# Patient Record
Sex: Male | Born: 1995 | Race: White | Hispanic: No | Marital: Single | Smoking: Current every day smoker
Health system: Southern US, Community
[De-identification: ages and names within clinical notes are randomized; demographics above are authoritative.]

## PROBLEM LIST (undated history)

## (undated) DIAGNOSIS — F319 Bipolar disorder, unspecified: Secondary | ICD-10-CM

## (undated) DIAGNOSIS — T1491XA Suicide attempt, initial encounter: Secondary | ICD-10-CM

## (undated) DIAGNOSIS — F1910 Other psychoactive substance abuse, uncomplicated: Secondary | ICD-10-CM

---

## 2016-05-18 ENCOUNTER — Encounter (HOSPITAL_COMMUNITY): Payer: Self-pay

## 2016-05-18 ENCOUNTER — Ambulatory Visit (HOSPITAL_COMMUNITY): Payer: 59 | Admitting: Psychiatry

## 2016-05-28 ENCOUNTER — Encounter (HOSPITAL_COMMUNITY): Payer: Self-pay

## 2016-05-28 ENCOUNTER — Emergency Department (HOSPITAL_COMMUNITY): Payer: 59

## 2016-05-28 ENCOUNTER — Inpatient Hospital Stay (HOSPITAL_COMMUNITY)
Admission: EM | Admit: 2016-05-28 | Discharge: 2016-05-30 | DRG: 896 | Disposition: A | Discharge status: Other Acute Inpatient Hospital | Payer: 59 | Attending: Internal Medicine | Admitting: Internal Medicine

## 2016-05-28 DIAGNOSIS — F319 Bipolar disorder, unspecified: Secondary | ICD-10-CM

## 2016-05-28 DIAGNOSIS — R000 Tachycardia, unspecified: Secondary | ICD-10-CM | POA: Diagnosis present

## 2016-05-28 DIAGNOSIS — Z046 Encounter for general psychiatric examination, requested by authority: Secondary | ICD-10-CM

## 2016-05-28 DIAGNOSIS — Z23 Encounter for immunization: Secondary | ICD-10-CM | POA: Diagnosis not present

## 2016-05-28 DIAGNOSIS — F411 Generalized anxiety disorder: Secondary | ICD-10-CM | POA: Diagnosis present

## 2016-05-28 DIAGNOSIS — Z9114 Patient's other noncompliance with medication regimen: Secondary | ICD-10-CM | POA: Diagnosis not present

## 2016-05-28 DIAGNOSIS — G4700 Insomnia, unspecified: Secondary | ICD-10-CM | POA: Diagnosis present

## 2016-05-28 DIAGNOSIS — Z4682 Encounter for fitting and adjustment of non-vascular catheter: Secondary | ICD-10-CM | POA: Diagnosis not present

## 2016-05-28 DIAGNOSIS — G92 Toxic encephalopathy: Secondary | ICD-10-CM | POA: Diagnosis not present

## 2016-05-28 DIAGNOSIS — F10129 Alcohol abuse with intoxication, unspecified: Principal | ICD-10-CM | POA: Diagnosis present

## 2016-05-28 DIAGNOSIS — F17210 Nicotine dependence, cigarettes, uncomplicated: Secondary | ICD-10-CM | POA: Diagnosis present

## 2016-05-28 DIAGNOSIS — Z818 Family history of other mental and behavioral disorders: Secondary | ICD-10-CM | POA: Diagnosis not present

## 2016-05-28 DIAGNOSIS — E876 Hypokalemia: Secondary | ICD-10-CM | POA: Diagnosis present

## 2016-05-28 DIAGNOSIS — J9600 Acute respiratory failure, unspecified whether with hypoxia or hypercapnia: Secondary | ICD-10-CM | POA: Diagnosis not present

## 2016-05-28 DIAGNOSIS — J690 Pneumonitis due to inhalation of food and vomit: Secondary | ICD-10-CM | POA: Diagnosis not present

## 2016-05-28 DIAGNOSIS — J0100 Acute maxillary sinusitis, unspecified: Secondary | ICD-10-CM | POA: Diagnosis present

## 2016-05-28 DIAGNOSIS — F10929 Alcohol use, unspecified with intoxication, unspecified: Secondary | ICD-10-CM | POA: Diagnosis not present

## 2016-05-28 DIAGNOSIS — G9340 Encephalopathy, unspecified: Secondary | ICD-10-CM | POA: Diagnosis not present

## 2016-05-28 DIAGNOSIS — Z915 Personal history of self-harm: Secondary | ICD-10-CM

## 2016-05-28 DIAGNOSIS — J9690 Respiratory failure, unspecified, unspecified whether with hypoxia or hypercapnia: Secondary | ICD-10-CM

## 2016-05-28 DIAGNOSIS — J988 Other specified respiratory disorders: Secondary | ICD-10-CM

## 2016-05-28 DIAGNOSIS — J9601 Acute respiratory failure with hypoxia: Secondary | ICD-10-CM | POA: Diagnosis not present

## 2016-05-28 DIAGNOSIS — R45850 Homicidal ideations: Secondary | ICD-10-CM | POA: Diagnosis present

## 2016-05-28 DIAGNOSIS — Y908 Blood alcohol level of 240 mg/100 ml or more: Secondary | ICD-10-CM | POA: Diagnosis not present

## 2016-05-28 DIAGNOSIS — F1024 Alcohol dependence with alcohol-induced mood disorder: Secondary | ICD-10-CM | POA: Diagnosis present

## 2016-05-28 DIAGNOSIS — R404 Transient alteration of awareness: Secondary | ICD-10-CM | POA: Diagnosis not present

## 2016-05-28 DIAGNOSIS — F1910 Other psychoactive substance abuse, uncomplicated: Secondary | ICD-10-CM

## 2016-05-28 DIAGNOSIS — R45851 Suicidal ideations: Secondary | ICD-10-CM | POA: Diagnosis present

## 2016-05-28 DIAGNOSIS — R55 Syncope and collapse: Secondary | ICD-10-CM | POA: Diagnosis not present

## 2016-05-28 DIAGNOSIS — T1491XA Suicide attempt, initial encounter: Secondary | ICD-10-CM | POA: Diagnosis not present

## 2016-05-28 DIAGNOSIS — R4182 Altered mental status, unspecified: Secondary | ICD-10-CM | POA: Diagnosis not present

## 2016-05-28 DIAGNOSIS — Z79899 Other long term (current) drug therapy: Secondary | ICD-10-CM | POA: Diagnosis not present

## 2016-05-28 DIAGNOSIS — F1014 Alcohol abuse with alcohol-induced mood disorder: Secondary | ICD-10-CM | POA: Diagnosis not present

## 2016-05-28 HISTORY — DX: Bipolar disorder, unspecified: F31.9

## 2016-05-28 HISTORY — DX: Other psychoactive substance abuse, uncomplicated: F19.10

## 2016-05-28 HISTORY — DX: Suicide attempt, initial encounter: T14.91XA

## 2016-05-28 LAB — BLOOD GAS, ARTERIAL
Acid-base deficit: 2.7 mmol/L — ABNORMAL HIGH (ref 0.0–2.0)
BICARBONATE: 22 mmol/L (ref 20.0–28.0)
DRAWN BY: 105551
FIO2: 40
MECHVT: 550 mL
O2 Saturation: 99.2 %
PEEP: 5 cmH2O
PO2 ART: 188 mmHg — AB (ref 83.0–108.0)
RATE: 16 resp/min
pCO2 arterial: 41.3 mmHg (ref 32.0–48.0)
pH, Arterial: 7.348 — ABNORMAL LOW (ref 7.350–7.450)

## 2016-05-28 LAB — URINALYSIS, ROUTINE W REFLEX MICROSCOPIC
Bilirubin Urine: NEGATIVE
Glucose, UA: NEGATIVE mg/dL
Hgb urine dipstick: NEGATIVE
Ketones, ur: NEGATIVE mg/dL
Leukocytes, UA: NEGATIVE
Nitrite: NEGATIVE
Protein, ur: NEGATIVE mg/dL
Specific Gravity, Urine: 1.006 (ref 1.005–1.030)
pH: 6 (ref 5.0–8.0)

## 2016-05-28 LAB — SALICYLATE LEVEL: Salicylate Lvl: <7 mg/dL (ref 2.8–30.0)

## 2016-05-28 LAB — RAPID URINE DRUG SCREEN, HOSP PERFORMED
AMPHETAMINES: NOT DETECTED
Barbiturates: NOT DETECTED
Benzodiazepines: NOT DETECTED
Cocaine: NOT DETECTED
OPIATES: NOT DETECTED
TETRAHYDROCANNABINOL: NOT DETECTED

## 2016-05-28 LAB — CBC WITH DIFFERENTIAL/PLATELET
BASOS ABS: 0 10*3/uL (ref 0.0–0.1)
BASOS PCT: 0 %
Eosinophils Absolute: 0.2 10*3/uL (ref 0.0–0.7)
Eosinophils Relative: 3 %
HEMATOCRIT: 53.8 % — AB (ref 39.0–52.0)
HEMOGLOBIN: 19.6 g/dL — AB (ref 13.0–17.0)
Lymphocytes Relative: 48 %
Lymphs Abs: 3.6 10*3/uL (ref 0.7–4.0)
MCH: 33.1 pg (ref 26.0–34.0)
MCHC: 36.4 g/dL — ABNORMAL HIGH (ref 30.0–36.0)
MCV: 90.9 fL (ref 78.0–100.0)
Monocytes Absolute: 0.3 10*3/uL (ref 0.1–1.0)
Monocytes Relative: 4 %
NEUTROS ABS: 3.3 10*3/uL (ref 1.7–7.7)
NEUTROS PCT: 45 %
Platelets: 305 10*3/uL (ref 150–400)
RBC: 5.92 MIL/uL — AB (ref 4.22–5.81)
RDW: 12.8 % (ref 11.5–15.5)
WBC: 7.5 10*3/uL (ref 4.0–10.5)

## 2016-05-28 LAB — COMPREHENSIVE METABOLIC PANEL WITH GFR
ALT: 33 U/L (ref 17–63)
AST: 31 U/L (ref 15–41)
Albumin: 5.3 g/dL — ABNORMAL HIGH (ref 3.5–5.0)
Alkaline Phosphatase: 86 U/L (ref 38–126)
Anion gap: 10 (ref 5–15)
BUN: 8 mg/dL (ref 6–20)
CO2: 27 mmol/L (ref 22–32)
Calcium: 8.8 mg/dL — ABNORMAL LOW (ref 8.9–10.3)
Chloride: 107 mmol/L (ref 101–111)
Creatinine, Ser: 1 mg/dL (ref 0.61–1.24)
GFR calc Af Amer: >60 mL/min
GFR calc non Af Amer: >60 mL/min
Glucose, Bld: 113 mg/dL — ABNORMAL HIGH (ref 65–99)
Potassium: 3.1 mmol/L — ABNORMAL LOW (ref 3.5–5.1)
Sodium: 144 mmol/L (ref 135–145)
Total Bilirubin: 0.4 mg/dL (ref 0.3–1.2)
Total Protein: 9 g/dL — ABNORMAL HIGH (ref 6.5–8.1)

## 2016-05-28 LAB — CK: Total CK: 92 U/L (ref 49–397)

## 2016-05-28 LAB — ACETAMINOPHEN LEVEL

## 2016-05-28 LAB — TROPONIN I: Troponin I: <0.03 ng/mL

## 2016-05-28 MED ORDER — SODIUM CHLORIDE 0.9 % IV BOLUS (SEPSIS)
1000.0000 mL | Freq: Once | INTRAVENOUS | Status: AC
Start: 2016-05-28 — End: 2016-05-28

## 2016-05-28 MED ORDER — SODIUM CHLORIDE 0.9 % IV BOLUS (SEPSIS)
1000.0000 mL | Freq: Once | INTRAVENOUS | Status: AC
  Administered 2016-05-28: 1000 mL via INTRAVENOUS

## 2016-05-28 MED ORDER — ROCURONIUM BROMIDE 50 MG/5ML IV SOLN
100.0000 mg | Freq: Once | INTRAVENOUS | Status: AC
  Administered 2016-05-28: 100 mg via INTRAVENOUS

## 2016-05-28 MED ORDER — PROPOFOL 1000 MG/100ML IV EMUL: 5.0000 ug/kg/min | Freq: Once | INTRAVENOUS | Status: DC

## 2016-05-28 MED ORDER — ETOMIDATE 2 MG/ML IV SOLN
10.0000 mg | Freq: Once | INTRAVENOUS | Status: AC
  Administered 2016-05-28: 10 mg via INTRAVENOUS

## 2016-05-28 MED ORDER — PROPOFOL 1000 MG/100ML IV EMUL
5.0000 ug/kg/min | Freq: Once | INTRAVENOUS | Status: AC
  Administered 2016-05-28: 5 ug/kg/min via INTRAVENOUS
  Filled 2016-05-28: qty 100

## 2016-05-28 MED ORDER — NALOXONE HCL 2 MG/2ML IJ SOSY
2.0000 mg | PREFILLED_SYRINGE | Freq: Once | INTRAMUSCULAR | Status: AC
  Administered 2016-05-28: 2 mg via INTRAVENOUS

## 2016-05-28 NOTE — ED Notes (Signed)
Family at bedside. 

## 2016-05-28 NOTE — ED Triage Notes (Addendum)
Law enforcement went to his house to serve IVC papers on him due to being bi-polar and not taking his medications.  Patient made statements that he would kill himself, or kill someone else and place their head on the father's porch.  [facility], EMS was called.  Patient had IV established enroute to ER by EMS, Blood sugar was 175 by EMS.

## 2016-05-28 NOTE — ED Provider Notes (Signed)
[facility]-EMERGENCY DEPT Provider Note   CSN: [id number] Arrival date & time: 05/28/16  2004 By signing my name below, I, Richard Larson, attest that this documentation has been prepared under the direction and in the presence of [name], MD . Electronically Signed: Levon Larson, Scribe. 05/28/2016. 8:34 PM.   History   Chief Complaint Chief Complaint  Patient presents with  . Altered Mental Status   LEVEL 5 CAVEAT: PATIENT UNRESPONSIVE  HPI Richard Larson is a 21 y.o. male, brought in by ambulance, who presents to the Emergency Department with a chief complaint of altered metal status with an unknown onset. Per triage note, pt has a PMHx of bipolar disorder and has been non-compliant with his medication. Patient made statements that he would kill himself, or kill someone else and place his their head on the father's porch. Law enforcement went to pt's house to serve IVC papers, but patient was found unresponsive next to a pool of vomit and a fifth bottle of an unknown alcohol beside him. No pills found near patient. No one has seen patient all day today, so it is unclear when he was last normal.   The history is provided by the EMS personnel. The history is limited by the condition of the patient. No language interpreter was used.    Past Medical History:  Diagnosis Date  . Bipolar 1 disorder (HCC)   . Drug abuse   . Suicide attempt     Patient Active Problem List   Diagnosis Date Noted  . Alcohol intoxication (HCC) 05/28/2016  . Bipolar 1 disorder (HCC) 05/28/2016  . Polysubstance abuse 05/28/2016  . Suicidal behavior with attempted self-injury 05/28/2016  . Involuntary commitment 05/28/2016  . Aspiration pneumonia (HCC) 05/28/2016    History reviewed. No pertinent surgical history.   Home Medications    Prior to Admission medications   Medication Sig Start Date End Date Taking? Authorizing Provider  ALPRAZolam [name]) 0.5 MG tablet Take 0.5 mg by mouth 4 (four) times  daily. 04/21/16  Yes Historical Provider, MD  diclofenac sodium (VOLTAREN) 1 % GEL Place 1 application onto the skin 4 (four) times daily as needed. 04/21/16  Yes Historical Provider, MD  escitalopram (LEXAPRO) 20 MG tablet Take 20 mg by mouth daily. 03/21/16 03/21/17 Yes Historical Provider, MD  zolpidem (AMBIEN) 5 MG tablet Take 5 mg by mouth at bedtime. 04/19/16 04/19/17 Yes Historical Provider, MD    Family History History reviewed. No pertinent family history.  Social History Social History  Substance Use Topics  . Smoking status: Unknown If Ever Smoked  . Smokeless tobacco: Never Used  . Alcohol use Yes     Allergies   Patient has no known allergies.   Review of Systems Review of Systems  Unable to perform ROS: Patient unresponsive   Physical Exam Updated Vital Signs BP 141/89   Pulse 100   Temp 98.1 F (36.7 C) (Axillary)   Resp 16   Ht 5\' 6"  (1.676 m)   Wt 178 lb 9.2 oz (81 kg)   SpO2 100%   BMI 28.82 kg/m   Physical Exam Physical Exam  Nursing note and vitals reviewed. Constitutional: Unresponsive Head: Normocephalic and atraumatic.  Mouth/Throat: Oropharynx is clear and moist.  Neck:Neck supple.  Cardiovascular: Tachycardic rate and regular rhythm.   Pulmonary/Chest: Effort normal and coarse breath sounds Abdominal: Soft. There is no tenderness. There is no rebound and no guarding.  Musculoskeletal: Normal range of motion.  Neurological: GCS of three, no gag reflex.  Pupils about 5mm, symmetric, reactive to light.  Skin: Skin is warm and dry.   Psychiatric: Cooperative   ED Treatments / Results  DIAGNOSTIC STUDIES:  Oxygen Saturation is 100% on non-rebreather mask.  Labs (all labs ordered are listed, but only abnormal results are displayed) Labs Reviewed  CBC WITH DIFFERENTIAL/PLATELET - Abnormal; Notable for the following:       Result Value   RBC 5.92 (*)    Hemoglobin 19.6 (*)    HCT 53.8 (*)    MCHC 36.4 (*)    All other components  within normal limits  COMPREHENSIVE METABOLIC PANEL - Abnormal; Notable for the following:    Potassium 3.1 (*)    Glucose, Bld 113 (*)    Calcium 8.8 (*)    Total Protein 9.0 (*)    Albumin 5.3 (*)    All other components within normal limits  URINALYSIS, ROUTINE W REFLEX MICROSCOPIC - Abnormal; Notable for the following:    Color, Urine STRAW (*)    All other components within normal limits  ACETAMINOPHEN LEVEL - Abnormal; Notable for the following:    Acetaminophen (Tylenol), Serum <10 (*)    All other components within normal limits  ETHANOL - Abnormal; Notable for the following:    Alcohol, Ethyl (B) 585 (*)    All other components within normal limits  BLOOD GAS, ARTERIAL - Abnormal; Notable for the following:    pH, Arterial 7.348 (*)    pO2, Arterial 188.0 (*)    Acid-base deficit 2.7 (*)    All other components within normal limits  RAPID URINE DRUG SCREEN, HOSP PERFORMED  SALICYLATE LEVEL  CK  TROPONIN I  CBC  CREATININE, SERUM  CBC  COMPREHENSIVE METABOLIC PANEL    EKG  EKG Interpretation  Date/Time:  Saturday May 28 2016 20:06:01 EST Ventricular Rate:  103 PR Interval:    QRS Duration: 100 QT Interval:  299 QTC Calculation: 392 R Axis:   51 Text Interpretation:  Sinus tachycardia Borderline repolarization abnormality no prior EKG Confirmed by Jaire Pinkham MD, Jayani Rozman [phone]) on 05/28/2016 10:52:19 PM       Radiology Ct Head Wo Contrast  Result Date: 05/28/2016 CLINICAL DATA:  Found unresponsive. EXAM: CT HEAD WITHOUT CONTRAST TECHNIQUE: Contiguous axial images were obtained from the base of the skull through the vertex without intravenous contrast. COMPARISON:  None. FINDINGS: Brain: There is no intracranial hemorrhage, mass or evidence of acute infarction. There is no extra-axial fluid collection. Gray matter and white matter appear normal. Cerebral volume is normal for age. Brainstem and posterior fossa are unremarkable. The CSF spaces appear normal. Vascular: No  hyperdense vessel or unexpected calcification. Skull: Normal. Negative for fracture or focal lesion. Sinuses/Orbits: Air-fluid levels in the maxillary sinuses bilaterally. Other: None. IMPRESSION: 1. Normal brain 2. Maxillary sinus air-fluid levels bilaterally. Electronically Signed   By: Ellery Plunkaniel R Mitchell M.D.   On: 05/28/2016 21:49   Dg Chest Portable 1 View  Result Date: 05/28/2016 CLINICAL DATA:  NG TUBE PLACEMENT, PATIENT FOUND UNRESPONSIVE BY DEPUTY WHEN SERVED WITH IVC PAPERS, EMS WAS CALLED, PATIENT HAS A HISTORY OF DRUG ABUSE, BIPOLAR 1 DISORDER, SUICIDE ATTEMPT. EXAM: PORTABLE CHEST 1 VIEW COMPARISON:  05/28/2016 FINDINGS: Endotracheal tube is in place, tip proximally 4.5 cm above the carina. A nasogastric tube has been placed, tip overlying the level of the stomach. Lungs are clear. IMPRESSION: Interval placement of nasogastric tube. Electronically Signed   By: Norva PavlovElizabeth  Brown M.D.   On: 05/28/2016 21:36   Dg Chest  Portable 1 View  Result Date: 05/28/2016 CLINICAL DATA:  Status post intubation. Found unresponsive by Designer, television/film set when they were attempting to serve IVC papers. Pt had made threats of suicide. EXAM: PORTABLE CHEST 1 VIEW COMPARISON:  None. FINDINGS: Endotracheal tube is in place with tip 4.8 cm above the carina. Heart size is normal. Lungs are clear. Patient is slightly rotated towards the right. IMPRESSION: Endotracheal tube in good position. No evidence for acute cardiopulmonary abnormality. Electronically Signed   By: Norva Pavlov M.D.   On: 05/28/2016 21:05    Procedures Procedures (including critical care time)  CRITICAL CARE Performed by: [name]   Total critical care time: 40 minutes  Critical care time was exclusive of separately billable procedures and treating other patients.  Critical care was necessary to treat or prevent imminent or life-threatening deterioration.  Critical care was time spent personally by me on the following  activities: development of treatment plan with patient and/or surrogate as well as nursing, discussions with consultants, evaluation of patient's response to treatment, examination of patient, obtaining history from patient or surrogate, ordering and performing treatments and interventions, ordering and review of laboratory studies, ordering and review of radiographic studies, pulse oximetry and re-evaluation of patient's condition.  INTUBATION Performed by: [name]  Required items: required blood products, implants, devices, and special equipment available Patient identity confirmed: provided demographic data and hospital-assigned identification number Time out: Immediately prior to procedure a "time out" was called to verify the correct patient, procedure, equipment, support staff and site/side marked as required.  Indications: airway protection  Intubation method: Glidescope Laryngoscopy   Preoxygenation: BVM  Sedatives: Etomidate Paralytic: Succinylcholine  Tube Size: 7.5 cuffed  Post-procedure assessment: chest rise and ETCO2 monitor Breath sounds: equal and absent over the epigastrium Tube secured with: ETT holder Chest x-ray interpreted by radiologist and me.  Chest x-ray findings: endotracheal tube inserted additional 2 cm as sitting high on CXR  Patient tolerated the procedure well with no immediate complications.    Medications Ordered in ED Medications  sodium chloride 0.9 % bolus 1,000 mL (0 mLs Intravenous Stopped 05/28/16 2130)  enoxaparin (LOVENOX) injection 40 mg (not administered)  sodium chloride flush (NS) 0.9 % injection 3 mL (not administered)  sodium chloride 0.9 % 1,000 mL with thiamine 100 mg, folic acid 1 mg, multivitamins adult 10 mL infusion (not administered)  dextrose 5 %-0.9 % sodium chloride infusion (not administered)  famotidine (PEPCID) IVPB 20 mg premix (not administered)  ampicillin-sulbactam (UNASYN) 1.5 g in sodium chloride 0.9 % 50 mL  IVPB (not administered)  sodium chloride 0.9 % bolus 1,000 mL (0 mLs Intravenous Stopped 05/28/16 2159)  propofol (DIPRIVAN) 1000 MG/100ML infusion (25 mcg/kg/min  77.1 kg Intravenous Rate/Dose Verify 05/29/16 0007)  naloxone [facility]) injection 2 mg (2 mg Intravenous Given 05/28/16 2007)  etomidate (AMIDATE) injection 10 mg (10 mg Intravenous Given 05/28/16 2021)  rocuronium (ZEMURON) injection 100 mg (100 mg Intravenous Given 05/28/16 2021)     Initial Impression / Assessment and Plan / ED Course  I have reviewed the triage vital signs and the nursing notes.  Pertinent labs & imaging results that were available during my care of the patient were reviewed by me and considered in my medical decision making (see chart for details).     Patient unresponsive, with GCS 3 on arrival and recent vomiting. Oxygenating normal and vital signs stable, but concerned for aspiration prior to arrival. No response with narcan. Intubated for airway protection.  CT head visualized and unremarkable. AMS from intoxication, as alcohol level of 585. Normal tylenol, and salicylate level. No acidosis. UDS unremarkable. Blood work otherwise reassuring. Father states all other psych meds aside from xanax he has not taken when he looked at his medications today. Unlikely to be other overdose.   Discussed with Dr. Conley Rolls who will admit to stepdown.   Final Clinical Impressions(s) / ED Diagnoses   Final diagnoses:  Alcoholic intoxication with complication [facility])  Airway compromise    New Prescriptions Current Discharge Medication List     I personally performed the services described in this documentation, which was scribed in my presence. The recorded information has been reviewed and is accurate.    [name], MD 05/29/16 [phone]

## 2016-05-28 NOTE — H&P (Signed)
History and Physical    Richard Larson VHQ:[id number] DOB: February 05, 1996 DOA: 05/28/2016  PCP: WHITE, Bonnell PublicMARSHA L, NP  Patient coming from: Home.    Chief Complaint:  Obtunded.   HPI: Richard SproutBraxston Kleier is an 21 y.o. male with hx of Bipolar illness, non compliant with his meds, polysubstance abuse including cig and alcohol, hx of prior suicidal ideation, was in an argument with his father whom he resided at, found obtunded when police arrived for serving IVC paper taken out by his father.  He may have some aspiration in the ER.  He was promptly intubated to protect his airways with paralytic agent, and is currently on Propofol.  His HR is 128, and BP 128.  Further work up included K 3.1, normal renal Fx test, normal WBC, and Hb of 19 g per dL.  Alcohol level 585 mg/dL, ASA and APAP were negative.  UDS showed no other illicit substance. ABG post intubation 7.34/41/POx= 188 on 40% post intubation.  CXR showed ET tube 4.5 cm above carina and NGT in place. Head CT showed sinus airfluid level.   ED Course:  See above.  Rewiew of Systems: Unable.    Past Medical History:  Diagnosis Date  . Bipolar 1 disorder (HCC)   . Drug abuse   . Suicide attempt     History reviewed. No pertinent surgical history.   has an unknown smoking status. He has never used smokeless tobacco. He reports that he drinks alcohol. He reports that he uses drugs.  No Known Allergies  History reviewed. No pertinent family history.   Prior to Admission medications   Medication Sig Start Date End Date Taking? Authorizing Provider  ALPRAZolam [name](XANAX) 0.5 MG tablet Take 0.5 mg by mouth 4 (four) times daily. 04/21/16  Yes Historical Provider, MD  diclofenac sodium (VOLTAREN) 1 % GEL Place 1 application onto the skin 4 (four) times daily as needed. 04/21/16  Yes Historical Provider, MD  escitalopram (LEXAPRO) 20 MG tablet Take 20 mg by mouth daily. 03/21/16 03/21/17 Yes Historical Provider, MD  zolpidem (AMBIEN) 5 MG tablet Take 5  mg by mouth at bedtime. 04/19/16 04/19/17 Yes Historical Provider, MD    Physical Exam: Vitals:   05/28/16 2245 05/28/16 2300 05/28/16 2315 05/28/16 2315  BP:  125/85  128/90  Pulse: 92 89 88 97  Resp: 14 16 17 20   Temp: 97.9 F (36.6 C) 97.9 F (36.6 C) 98.1 F (36.7 C) 98.4 F (36.9 C)  TempSrc:    Core (Comment)  SpO2: 100% 100% 100% 100%  Weight:      Height:    6\' 1"  (1.854 m)    Constitutional: NAD, calm, comfortable Vitals:   05/28/16 2245 05/28/16 2300 05/28/16 2315 05/28/16 2315  BP:  125/85  128/90  Pulse: 92 89 88 97  Resp: 14 16 17 20   Temp: 97.9 F (36.6 C) 97.9 F (36.6 C) 98.1 F (36.7 C) 98.4 F (36.9 C)  TempSrc:    Core (Comment)  SpO2: 100% 100% 100% 100%  Weight:      Height:    6\' 1"  (1.854 m)   Eyes: PERRL, lids and conjunctivae normal ENMT: Mucous membranes are moist. Posterior pharynx clear of any exudate or lesions.Normal dentition.  Neck: normal, supple, no masses, no thyromegaly Respiratory: clear to auscultation bilaterally, no wheezing, no crackles. Normal respiratory effort. No accessory muscle use.  Cardiovascular: Regular rate and rhythm, no murmurs / rubs / gallops. No extremity edema. 2+ pedal pulses. No carotid bruits.  Abdomen: no tenderness, no masses palpated. No hepatosplenomegaly. Bowel sounds positive.  Musculoskeletal: no clubbing / cyanosis. No joint deformity upper and lower extremities. Good ROM, no contractures. Normal muscle tone.  Skin: no rashes, lesions, ulcers. No induration Neurologic: obtunded.  Psychiatric: Unable.   Labs on Admission: I have personally reviewed following labs and imaging studies  CBC:  Recent Labs Lab 05/28/16 2029  WBC 7.5  NEUTROABS 3.3  HGB 19.6*  HCT 53.8*  MCV 90.9  PLT 305   Basic Metabolic Panel:  Recent Labs Lab 05/28/16 2029  NA 144  K 3.1*  CL 107  CO2 27  GLUCOSE 113*  BUN 8  CREATININE 1.00  CALCIUM 8.8*   GFR: Estimated Creatinine Clearance: 128.5 mL/min (by  C-G formula based on SCr of 1 mg/dL). Liver Function Tests:  Recent Labs Lab 05/28/16 2029  AST 31  ALT 33  ALKPHOS 86  BILITOT 0.4  PROT 9.0*  ALBUMIN 5.3*   Cardiac Enzymes:  Recent Labs Lab 05/28/16 2029 05/28/16 2121  CKTOTAL 92  --   TROPONINI  --  <0.03    Urine analysis:    Component Value Date/Time   COLORURINE STRAW (A) 05/28/2016 2121   APPEARANCEUR CLEAR 05/28/2016 2121   LABSPEC 1.006 05/28/2016 2121   PHURINE 6.0 05/28/2016 2121   GLUCOSEU NEGATIVE 05/28/2016 2121   HGBUR NEGATIVE 05/28/2016 2121   BILIRUBINUR NEGATIVE 05/28/2016 2121   KETONESUR NEGATIVE 05/28/2016 2121   PROTEINUR NEGATIVE 05/28/2016 2121   NITRITE NEGATIVE 05/28/2016 2121   LEUKOCYTESUR NEGATIVE 05/28/2016 2121    Radiological Exams on Admission: Ct Head Wo Contrast  Result Date: 05/28/2016 CLINICAL DATA:  Found unresponsive. EXAM: CT HEAD WITHOUT CONTRAST TECHNIQUE: Contiguous axial images were obtained from the base of the skull through the vertex without intravenous contrast. COMPARISON:  None. FINDINGS: Brain: There is no intracranial hemorrhage, mass or evidence of acute infarction. There is no extra-axial fluid collection. Gray matter and white matter appear normal. Cerebral volume is normal for age. Brainstem and posterior fossa are unremarkable. The CSF spaces appear normal. Vascular: No hyperdense vessel or unexpected calcification. Skull: Normal. Negative for fracture or focal lesion. Sinuses/Orbits: Air-fluid levels in the maxillary sinuses bilaterally. Other: None. IMPRESSION: 1. Normal brain 2. Maxillary sinus air-fluid levels bilaterally. Electronically Signed   By: Ellery Plunk M.D.   On: 05/28/2016 21:49   Dg Chest Portable 1 View  Result Date: 05/28/2016 CLINICAL DATA:  NG TUBE PLACEMENT, PATIENT FOUND UNRESPONSIVE BY DEPUTY WHEN SERVED WITH IVC PAPERS, EMS WAS CALLED, PATIENT HAS A HISTORY OF DRUG ABUSE, BIPOLAR 1 DISORDER, SUICIDE ATTEMPT. EXAM: PORTABLE CHEST 1 VIEW  COMPARISON:  05/28/2016 FINDINGS: Endotracheal tube is in place, tip proximally 4.5 cm above the carina. A nasogastric tube has been placed, tip overlying the level of the stomach. Lungs are clear. IMPRESSION: Interval placement of nasogastric tube. Electronically Signed   By: Norva Pavlov M.D.   On: 05/28/2016 21:36   Dg Chest Portable 1 View  Result Date: 05/28/2016 CLINICAL DATA:  Status post intubation. Found unresponsive by Designer, television/film set when they were attempting to serve IVC papers. Pt had made threats of suicide. EXAM: PORTABLE CHEST 1 VIEW COMPARISON:  None. FINDINGS: Endotracheal tube is in place with tip 4.8 cm above the carina. Heart size is normal. Lungs are clear. Patient is slightly rotated towards the right. IMPRESSION: Endotracheal tube in good position. No evidence for acute cardiopulmonary abnormality. Electronically Signed   By: Norva Pavlov M.D.   On:  05/28/2016 21:05    EKG: Independently reviewed.   Assessment/Plan Principal Problem:   Alcohol intoxication (HCC) Active Problems:   Bipolar 1 disorder (HCC)   Polysubstance abuse   Suicidal behavior with attempted self-injury   Involuntary commitment   Aspiration pneumonia (HCC)    PLAN:   Alcohol intoxication:  Give Dextrose, along with IV banana bag.  Binge drinker.  Currently on IV propofol.  Will consult PCCM here at [facility] to help with weaning when appropriate.   Keep intubated for tonight.  Will given IV Unasyn for sinusitis and known aspiration in the ER.  Give PPI prophylasix.   Bipolar Disorder, non compliant, hx of suicidal ideation:  Please consult telepsych when he is awake and able to converse.  Note his IVC status.   Intubation:  It was for airway protection.  Suspect he will have no trouble with eventual extubation.  Sinusitis:  Will start Unasyn.  Cover for aspiration as well.    DVT prophylaxis: sub Q heparin.  Code Status: FULL CODE.  Family Communication: father at bedside.    Disposition Plan: TBD.  Consults called: None.  Admission status: Inpatient.    Chen Saadeh MD FACP. [facility]  If 7PM-7AM, please contact night-coverage [email or web address] Password [email or web address]  05/28/2016, 11:44 PM

## 2016-05-29 ENCOUNTER — Encounter (HOSPITAL_COMMUNITY): Payer: Self-pay | Admitting: Behavioral Health

## 2016-05-29 ENCOUNTER — Inpatient Hospital Stay (HOSPITAL_COMMUNITY): Payer: 59

## 2016-05-29 DIAGNOSIS — F10929 Alcohol use, unspecified with intoxication, unspecified: Secondary | ICD-10-CM

## 2016-05-29 DIAGNOSIS — J9600 Acute respiratory failure, unspecified whether with hypoxia or hypercapnia: Secondary | ICD-10-CM

## 2016-05-29 DIAGNOSIS — T1491XA Suicide attempt, initial encounter: Secondary | ICD-10-CM

## 2016-05-29 DIAGNOSIS — F1010 Alcohol abuse, uncomplicated: Secondary | ICD-10-CM

## 2016-05-29 DIAGNOSIS — F1910 Other psychoactive substance abuse, uncomplicated: Secondary | ICD-10-CM

## 2016-05-29 DIAGNOSIS — G9340 Encephalopathy, unspecified: Secondary | ICD-10-CM

## 2016-05-29 DIAGNOSIS — J690 Pneumonitis due to inhalation of food and vomit: Secondary | ICD-10-CM

## 2016-05-29 DIAGNOSIS — F489 Nonpsychotic mental disorder, unspecified: Secondary | ICD-10-CM

## 2016-05-29 DIAGNOSIS — E876 Hypokalemia: Secondary | ICD-10-CM

## 2016-05-29 LAB — COMPREHENSIVE METABOLIC PANEL
ALBUMIN: 3.9 g/dL (ref 3.5–5.0)
ALK PHOS: 60 U/L (ref 38–126)
ALT: 23 U/L (ref 17–63)
ANION GAP: 13 (ref 5–15)
AST: 18 U/L (ref 15–41)
BILIRUBIN TOTAL: 0.4 mg/dL (ref 0.3–1.2)
BUN: 6 mg/dL (ref 6–20)
CALCIUM: 7.8 mg/dL — AB (ref 8.9–10.3)
CO2: 21 mmol/L — ABNORMAL LOW (ref 22–32)
Chloride: 111 mmol/L (ref 101–111)
Creatinine, Ser: 0.79 mg/dL (ref 0.61–1.24)
GFR calc Af Amer: >60 mL/min (ref >60)
GLUCOSE: 126 mg/dL — AB (ref 65–99)
Potassium: 3.2 mmol/L — ABNORMAL LOW (ref 3.5–5.1)
Sodium: 145 mmol/L (ref 135–145)
TOTAL PROTEIN: 6.5 g/dL (ref 6.5–8.1)

## 2016-05-29 LAB — CBC
HEMATOCRIT: 42.6 % (ref 39.0–52.0)
HEMOGLOBIN: 15.4 g/dL (ref 13.0–17.0)
MCH: 32.5 pg (ref 26.0–34.0)
MCHC: 36.2 g/dL — AB (ref 30.0–36.0)
MCV: 89.9 fL (ref 78.0–100.0)
Platelets: 274 10*3/uL (ref 150–400)
RBC: 4.74 MIL/uL (ref 4.22–5.81)
RDW: 12.9 % (ref 11.5–15.5)
WBC: 8.4 10*3/uL (ref 4.0–10.5)

## 2016-05-29 LAB — VITAMIN B12: VITAMIN B 12: 420 pg/mL (ref 180–914)

## 2016-05-29 LAB — ETHANOL: ALCOHOL ETHYL (B): 585 mg/dL — AB (ref <5)

## 2016-05-29 LAB — TROPONIN I: Troponin I: <0.03 ng/mL (ref <0.03)

## 2016-05-29 LAB — TSH: TSH: 1.423 u[IU]/mL (ref 0.350–4.500)

## 2016-05-29 LAB — MAGNESIUM: MAGNESIUM: 1.7 mg/dL (ref 1.7–2.4)

## 2016-05-29 MED ORDER — HALOPERIDOL LACTATE 5 MG/ML IJ SOLN
5.0000 mg | Freq: Four times a day (QID) | INTRAMUSCULAR | Status: DC | PRN
  Administered 2016-05-29 (×3): 5 mg via INTRAVENOUS
  Filled 2016-05-29 (×3): qty 1

## 2016-05-29 MED ORDER — POTASSIUM CHLORIDE 10 MEQ/100ML IV SOLN
10.0000 meq | INTRAVENOUS | Status: AC
  Administered 2016-05-29 (×3): 10 meq via INTRAVENOUS
  Filled 2016-05-29 (×2): qty 100

## 2016-05-29 MED ORDER — DEXTROSE-NACL 5-0.9 % IV SOLN
INTRAVENOUS | Status: DC
  Administered 2016-05-29: 01:00:00 via INTRAVENOUS

## 2016-05-29 MED ORDER — FOLIC ACID 5 MG/ML IJ SOLN
INTRAMUSCULAR | Status: AC
  Filled 2016-05-29: qty 0.2

## 2016-05-29 MED ORDER — SODIUM CHLORIDE 0.9 % IV SOLN
INTRAVENOUS | Status: AC
  Filled 2016-05-29: qty 1.5

## 2016-05-29 MED ORDER — DEXTROSE-NACL 5-0.9 % IV SOLN
INTRAVENOUS | Status: DC
  Administered 2016-05-29 – 2016-05-30 (×3): via INTRAVENOUS
  Filled 2016-05-29 (×6): qty 1000

## 2016-05-29 MED ORDER — THIAMINE HCL 100 MG/ML IJ SOLN
INTRAMUSCULAR | Status: AC
  Filled 2016-05-29: qty 2

## 2016-05-29 MED ORDER — THIAMINE HCL 100 MG/ML IJ SOLN
100.0000 mg | Freq: Every day | INTRAMUSCULAR | Status: DC
  Administered 2016-05-29 – 2016-05-30 (×2): 100 mg via INTRAVENOUS
  Filled 2016-05-29 (×2): qty 2

## 2016-05-29 MED ORDER — PROPOFOL 1000 MG/100ML IV EMUL
INTRAVENOUS | Status: AC
  Filled 2016-05-29: qty 100

## 2016-05-29 MED ORDER — POTASSIUM CHLORIDE CRYS ER 20 MEQ PO TBCR: 30.0000 meq | EXTENDED_RELEASE_TABLET | Freq: Once | ORAL | Status: DC

## 2016-05-29 MED ORDER — PROPOFOL 1000 MG/100ML IV EMUL
5.0000 ug/kg/min | Freq: Once | INTRAVENOUS | Status: AC
  Administered 2016-05-29: 40 ug/kg/min via INTRAVENOUS

## 2016-05-29 MED ORDER — CITALOPRAM HYDROBROMIDE 20 MG PO TABS
20.0000 mg | ORAL_TABLET | Freq: Every day | ORAL | Status: DC
  Administered 2016-05-29 – 2016-05-30 (×2): 20 mg via ORAL
  Filled 2016-05-29 (×3): qty 1

## 2016-05-29 MED ORDER — SODIUM CHLORIDE 0.9% FLUSH
3.0000 mL | Freq: Two times a day (BID) | INTRAVENOUS | Status: DC
  Administered 2016-05-29 (×2): 3 mL via INTRAVENOUS

## 2016-05-29 MED ORDER — POTASSIUM CHLORIDE CRYS ER 10 MEQ PO TBCR
10.0000 meq | EXTENDED_RELEASE_TABLET | Freq: Once | ORAL | Status: AC
  Administered 2016-05-29: 10 meq via ORAL
  Filled 2016-05-29: qty 1

## 2016-05-29 MED ORDER — AMOXICILLIN-POT CLAVULANATE 875-125 MG PO TABS: 1.0000 | ORAL_TABLET | Freq: Two times a day (BID) | ORAL | 0 refills | Status: DC

## 2016-05-29 MED ORDER — ENOXAPARIN SODIUM 40 MG/0.4ML ~~LOC~~ SOLN
40.0000 mg | SUBCUTANEOUS | Status: DC
Start: 2016-05-29 — End: 2016-05-30
  Administered 2016-05-29 – 2016-05-30 (×2): 40 mg via SUBCUTANEOUS
  Filled 2016-05-29 (×2): qty 0.4

## 2016-05-29 MED ORDER — NICOTINE 14 MG/24HR TD PT24
14.0000 mg | MEDICATED_PATCH | Freq: Every day | TRANSDERMAL | Status: DC
  Administered 2016-05-29 – 2016-05-30 (×2): 14 mg via TRANSDERMAL
  Filled 2016-05-29 (×2): qty 1

## 2016-05-29 MED ORDER — SODIUM CHLORIDE 0.9 % IV SOLN
1.5000 g | Freq: Four times a day (QID) | INTRAVENOUS | Status: DC
  Administered 2016-05-29 (×2): 1.5 g via INTRAVENOUS
  Filled 2016-05-29 (×7): qty 1.5

## 2016-05-29 MED ORDER — M.V.I. ADULT IV INJ
INJECTION | Freq: Once | INTRAVENOUS | Status: AC
  Administered 2016-05-29: 02:00:00 via INTRAVENOUS
  Filled 2016-05-29: qty 1000

## 2016-05-29 MED ORDER — M.V.I. ADULT IV INJ
INJECTION | INTRAVENOUS | Status: AC
  Filled 2016-05-29: qty 10

## 2016-05-29 MED ORDER — METOPROLOL TARTRATE 25 MG PO TABS
12.5000 mg | ORAL_TABLET | Freq: Two times a day (BID) | ORAL | Status: DC
  Administered 2016-05-29 – 2016-05-30 (×3): 12.5 mg via ORAL
  Filled 2016-05-29 (×4): qty 1

## 2016-05-29 MED ORDER — FAMOTIDINE IN NACL 20-0.9 MG/50ML-% IV SOLN
20.0000 mg | Freq: Two times a day (BID) | INTRAVENOUS | Status: DC
  Administered 2016-05-29 – 2016-05-30 (×4): 20 mg via INTRAVENOUS
  Filled 2016-05-29 (×4): qty 50

## 2016-05-29 MED ORDER — AMOXICILLIN-POT CLAVULANATE 875-125 MG PO TABS
1.0000 | ORAL_TABLET | Freq: Two times a day (BID) | ORAL | Status: DC
  Administered 2016-05-29 – 2016-05-30 (×3): 1 via ORAL
  Filled 2016-05-29 (×7): qty 1

## 2016-05-29 MED ORDER — LORAZEPAM 2 MG/ML IJ SOLN
2.0000 mg | INTRAMUSCULAR | Status: DC | PRN
  Administered 2016-05-29 (×2): 3 mg via INTRAVENOUS
  Administered 2016-05-30: 2 mg via INTRAVENOUS
  Filled 2016-05-29 (×3): qty 2
  Filled 2016-05-29: qty 1
  Filled 2016-05-29: qty 2

## 2016-05-29 NOTE — BH Assessment (Signed)
Binnie RailJoAnn Glover, [facility] at  [facility], confirmed that a bed will be available for Pt in the morning. Pt has been accepted to the service of Dr. Carmon GinsbergF. Cobos. [facility] staff will need to call [facility] at [phone] after 0800 for bed assignment and transfer time.  Notified Liborio NixonJanice, Charity fundraiserN at [facility] of acceptance.   Harlin RainFord Ellis Patsy BaltimoreWarrick Jr, LPC, [facility], [facility] Triage Specialist [phone]

## 2016-05-29 NOTE — Progress Notes (Signed)
Patient moved 3 different times by RT , once to CAT scan, once to er 18 from room 1, one from er to iccu 5.

## 2016-05-29 NOTE — BH Assessment (Signed)
Tele Assessment Note   Richard Larson is an 21 y.o. male who presented to [facility] on 05/28/16 under IVC -- father petitioned for involuntary commitment after Pt ingested a significant amount of alcohol and, per father's report, threatened to kill himself and others (cutting heads off and putting them on father's porch).  Pt was found comatose in front of the family home he shares with his father and had to be given Narcan.  Pt was intubated and put in the ICU.  He is now medically cleared.  Per report, Pt was belligerent this morning and extubated himself.  He was calm during assessment.  Pt reported that he got upset yesterday because of conflict he is having with his girlfriend over the custody of their daughter.  He denied current suicidal/homicidal ideation, auditory/visual hallucination, or self-injury.  He stated that he wanted to go home.  Father, who petitioned for IVC, reported that yesterday Pt consumed at least 770 ml of alcohol, began to make suicidal threats against himself (which father said he has done before), and threatened to cut the heads off of various people.  Upon admission, Pt had a BAL of 595.  Pt reported that he has a history of anxiety and depression for which he is prescribed Xanax and Ambien (Pt could not identify when he was diagnosed or who prescribed).  Pt is not taking any medication currently.  Per report, Pt has a standing diagnosis of Bipolar.  Also per report -- Pt was combative with staff earlier today.  During assessment, Pt presented as alert and oriented.  He had fair eye contact and was cooperative.  He was resting on his hospital bed and was dressed in scrubs or gown pulled down to his waist.  Pt's mood was preoccupied and irritable/angry.  Affect was sad, irritable/angry.  Pt denied current suicidal or homicidal ideation (although father stated that he expressed such on 05/28/16).  Pt endorsed poor sleep, irritability, and significant social stressors (custody issue with  girlfriend).  Pt's speech was normal in rate, rhythm, and volume.  Thought processes were within normal range, and thought content was logical.  He stated he wanted to go home.  There was no evidence of delusion.  Pt's memory and concentration were fair.  Insight, judgment, and impulse control were poor.  Consulted with Irving BurtonL Parks, NP.  Based on Pt's suicidal/homicidal statements and impulsive and large intake of alcohol, recommend inpatient placement.  Diagnosis: Bipolar D/O per report; Alcohol-Induced Mood Disorder  Past Medical History:  Past Medical History:  Diagnosis Date  . Bipolar 1 disorder (HCC)   . Drug abuse   . Suicide attempt     History reviewed. No pertinent surgical history.  Family History: History reviewed. No pertinent family history.  Social History:  has an unknown smoking status. He has never used smokeless tobacco. He reports that he drinks alcohol. He reports that he does not use drugs.  Additional Social History:  Alcohol / Drug Use Pain Medications: See PTA Prescriptions: See PTA Over the Counter: See PTA History of alcohol / drug use?: Yes Substance #1 Name of Substance 1: Alcohol 1 - Amount (size/oz): 750 ml 1 - Frequency: Daily 1 - Duration: ongoing 1 - Last Use / Amount: 05/28/16 (BAL was 585)  CIWA: CIWA-Ar BP: 117/73 Pulse Rate: 92 Nausea and Vomiting: no nausea and no vomiting Tactile Disturbances: none Tremor: no tremor Auditory Disturbances: not present Paroxysmal Sweats: no sweat visible Visual Disturbances: not present Anxiety: no anxiety, at ease Headache,  Fullness in Head: none present Agitation: normal activity Orientation and Clouding of Sensorium: oriented and can do serial additions CIWA-Ar Total: 0 COWS:    PATIENT STRENGTHS: (choose at least two) Average or above average intelligence Communication skills  Allergies: No Known Allergies  Home Medications:  Medications Prior to Admission  Medication Sig Dispense Refill  .  ALPRAZolam (XANAX) 0.5 MG tablet Take 0.5 mg by mouth 4 (four) times daily.    . diclofenac sodium (VOLTAREN) 1 % GEL Place 1 application onto the skin 4 (four) times daily as needed.    Marland Kitchen escitalopram (LEXAPRO) 20 MG tablet Take 20 mg by mouth daily.    Marland Kitchen zolpidem (AMBIEN) 5 MG tablet Take 5 mg by mouth at bedtime.      OB/GYN Status:  No LMP for male patient.  General Assessment Data Location of Assessment: [facility] ([facility]) TTS Assessment: In system Is this a Tele or Face-to-Face Assessment?: Tele Assessment Is this an Initial Assessment or a Re-assessment for this encounter?: Initial Assessment Marital status: Single Living Arrangements: Parent (Lives with father) Can pt return to current living arrangement?: Yes Admission Status: Voluntary Is patient capable of signing voluntary admission?: Yes Referral Source: Self/Family/Friend (Father)     Crisis Care Plan Living Arrangements: Parent (Lives with father) Name of Psychiatrist: None Name of Therapist: None  Education Status Is patient currently in school?: No  Risk to self with the past 6 months Suicidal Ideation: No-Not Currently/Within Last 6 Months (Per father's report, Pt endorsed SI and HI last night) Has patient been a risk to self within the past 6 months prior to admission? : No Suicidal Intent: No-Not Currently/Within Last 6 Months Has patient had any suicidal intent within the past 6 months prior to admission? : No Is patient at risk for suicide?: Yes Suicidal Plan?: No Has patient had any suicidal plan within the past 6 months prior to admission? : No Access to Means: No What has been your use of drugs/alcohol within the last 12 months?: Alcohol Previous Attempts/Gestures: No Other Self Harm Risks: Significant alcohol use Family Suicide History: No Recent stressful life event(s): Conflict (Comment) (Conflict with girlfriend over daughter) Persecutory voices/beliefs?: No Depression: Yes Depression Symptoms:  Despondent, Feeling worthless/self pity, Feeling angry/irritable, Insomnia Substance abuse history and/or treatment for substance abuse?: Yes Suicide prevention information given to non-admitted patients: Not applicable  Risk to Others within the past 6 months Homicidal Ideation: No-Not Currently/Within Last 6 Months Does patient have any lifetime risk of violence toward others beyond the six months prior to admission? : No Thoughts of Harm to Others: No-Not Currently Present/Within Last 6 Months Current Homicidal Intent: No-Not Currently/Within Last 6 Months Current Homicidal Plan: No-Not Currently/Within Last 6 Months Access to Homicidal Means: No History of harm to others?: No Assessment of Violence: None Noted Does patient have access to weapons?: No Criminal Charges Pending?: No Does patient have a court date: No Is patient on probation?: No  Psychosis Hallucinations: None noted Delusions: None noted  Mental Status Report Appearance/Hygiene: Unremarkable, In scrubs Eye Contact: Fair Motor Activity: Freedom of movement, Unremarkable Speech: Unremarkable, Logical/coherent Level of Consciousness: Alert Mood: Preoccupied, Irritable, Sad Affect: Appropriate to circumstance Anxiety Level: None Thought Processes: Coherent, Relevant Judgement: Impaired Orientation: Person, Place, Time, Situation Obsessive Compulsive Thoughts/Behaviors: None  Cognitive Functioning Concentration: Good Memory: Remote Intact, Recent Intact IQ: Average Insight: Poor Impulse Control: Poor Appetite: Good Sleep: Decreased Total Hours of Sleep: 5 Vegetative Symptoms: None  ADLScreening [facility] Assessment Services) Patient's  cognitive ability adequate to safely complete daily activities?: Yes Patient able to express need for assistance with ADLs?: Yes Independently performs ADLs?: Yes (appropriate for developmental age)  Prior Inpatient Therapy Prior Inpatient Therapy: No  Prior Outpatient  Therapy Prior Outpatient Therapy: Yes Prior Therapy Dates: Unknown Prior Therapy Facilty/Provider(s): Unknown Reason for Treatment: Bipolar Does patient have an ACCT team?: No Does patient have Intensive In-House Services?  : No Does patient have [facility] services? : No Does patient have P4CC services?: No  ADL Screening (condition at time of admission) Patient's cognitive ability adequate to safely complete daily activities?: Yes Is the patient deaf or have difficulty hearing?: No Does the patient have difficulty seeing, even when wearing glasses/contacts?: No Does the patient have difficulty concentrating, remembering, or making decisions?: No Patient able to express need for assistance with ADLs?: Yes Does the patient have difficulty dressing or bathing?: No Independently performs ADLs?: Yes (appropriate for developmental age) Does the patient have difficulty walking or climbing stairs?: No Weakness of Legs: None Weakness of Arms/Hands: None  Home Assistive Devices/Equipment Home Assistive Devices/Equipment: None  Therapy Consults (therapy consults require a physician order) PT Evaluation Needed: No OT Evalulation Needed: No SLP Evaluation Needed: No Abuse/Neglect Assessment (Assessment to be complete while patient is alone) Physical Abuse: Denies Verbal Abuse: Denies Sexual Abuse: Denies Exploitation of patient/patient's resources: Denies Self-Neglect: Denies Values / Beliefs Cultural Requests During Hospitalization: None Spiritual Requests During Hospitalization: None Consults Spiritual Care Consult Needed: No Social Work Consult Needed: No Merchant navy officer (For Healthcare) Does Patient Have a Medical Advance Directive?: No Would patient like information on creating a medical advance directive?: Yes (Inpatient - patient requests chaplain consult to create a medical advance directive)    Additional Information 1:1 In Past 12 Months?: No CIRT Risk: No Elopement  Risk: No Does patient have medical clearance?: Yes     Disposition:  Disposition Initial Assessment Completed for this Encounter: Yes Disposition of Patient: Inpatient treatment program Type of inpatient treatment program: Adult (Per Irving Burton NP Pt meets inpt criteria)  Richard Larson 05/29/2016 3:16 PM

## 2016-05-29 NOTE — Progress Notes (Signed)
Patient cleaned up, sheets changed and put in scrub pants.

## 2016-05-29 NOTE — Progress Notes (Signed)
Pt became very agitated took his tube out, leads off, got out of his restraints, cursing at staff, threaten to leave. Security was called, Patient walked out of room and was walking nude in the hallway disturbing other pts. Refusing help from staff while swinging at security. Security got him back in bed myself and other nurses helped assist security,  patient was still physical with staff.  [employer] was called to assist. [employer] came out and got him secured. Patient is calm and is handcuffed to the bed. [employer] and [employer] is still assisting us at this time.

## 2016-05-29 NOTE — Discharge Summary (Signed)
Physician Discharge Summary  Richard Larson ZOX:[id number] DOB: 1995-07-10 DOA: 05/28/2016  PCP: April Manson, NP  Admit date: 05/28/2016 Discharge date: 05/29/2016  Admitted From: Home Disposition:  Behavioral Health Institution  Recommendations for Outpatient Follow-up:  1. Follow up with PCP in 1-2 weeks 2. Please obtain BMP/CBC in one week 3. Please follow up on Chevy antibody, hepatitis B surface antigen, hepatitis C antibody, RPR results   Discharge Condition: Stable CODE STATUS:FULL Diet recommendation: Regular   Brief/Interim Summary: 21 year old male with a history of bipolar disorder noncompliant with medications presented to the emergency department unresponsive. History initially obtained from medical record and the previous triage notes. Apparently, the patient had an argument with his father with whom he lives earlier in the day. Patient made statements that he would kill himself, or kill someone else and place his their head on the father's porch. Law enforcement went to pt'shouse to serve IVC papers, but patient was found unresponsive next to a pool of vomit and a fifth bottle of an unknown alcohol beside him. Narcan was given with no response. The patient was intubated in the emergency department protect his airway. Initial blood work showed alcohol level 585.  Besides potassium of 3.1, his BMP, LFTs and CBC were unremarkable. Chest x-ray was negative. CT of the brain was negative except for air-fluid level in the maxillary sinus. CPK was 92. Urine drug screen, salicylate, acetaminophen was negative. Pulmonary medicine was consulted to see the patient and assist with vent management. Once the patient's sedation was decreased, the patient self extubated. Soon thereafter, the patient became belligerent and agitated threatening harm to the nursing staff and required restraining by police and security. The patient was given Haldol and Ativan with improvement of his behavior. The  patient's vital signs remained stable. He was afebrile and hemodynamically stable with oxygen saturation 100 percent on room air. The patient was reassessed after his behavior improved. He denied any fevers, chills, chest pain, shortness breath, dizziness, coughing, hemoptysis, nausea, vomiting, abdominal pain, dysuria, hematuria. The patient was alert and oriented 4. The patient did become tachycardic with HR 120-140. However, he was asymptomatic. EKG was obtained and showed sinus tachycardia without ST-T wave changes or AV block. The patient was hypokalemic, and this was repleted. The remainder of his CMP and CBC were unremarkable. TSH was 1.423, and troponins were negative 2. The remainder of his blood work including HIV, viral hepatitis serologies, and RPR were ending at the time of discharge. The patient was deemed to be medically stable to be discharged to a behavioral health facility.  During the hospitalization, his father did arrive and stated that the patient did not take any extraneous pills or other illicit substances. His father stated that the patient has a history of binge alcohol drinking. Prior to 05/28/2016, the patient last drank approximately 1 month prior to this admission. His father stated that he instituted the IVC in order to get the patient some help.  Discharge Diagnoses:  Acute toxic encephalopathy -Secondary to alcohol intoxication -Urine drug screen negative -Alcohol level 585 -CK 92 -Salicylate and acetaminophen levels negative -Started thiamine -Serum B12--pending -TSH--pending -personally reviewed EKG--sinus tachycardia, nonspecific ST changes -improved; remained A&Ox4 after extubation and sedation turned off  Suicidal ideation/suicide attempt/bipolar disorder -Patient was IVC by his family--he was served his IVC by law enforcement once he was deemed medically stable, which he is -consulted telepsychiatry--follow their recommendation -pt is deemed stable from  medical standpoint to go to behavioral heatlh facility  Acute  respiratory failure with hypoxia -Consult critical care to assist with ventilator management -Secondary to alcohol intoxication -H2 blockade prophylaxis -personally reviewed CXR--lungs clear -After extubation, the patient did not have any distress or shortness of breath with oxygen saturation 96-100% on room air.  Aspiration event -Patient was found next to pool of vomitus -Continue Unasyn--initially continued, but this will be treated discontinued at the time of discharge -d/c with augmentin x 6 more days  Hypokalemia -repleted -check mag--1.7  Polysubstance abuse -Etoh and tobacco -thiamine  Acute Maxillary Sinusitis -continue unasyn for now--initially continued, but this will be treated discontinued at the time of discharge -d/c with Augmentin x 6 more days  Health maintenance -HIV antibody--pending at time of discharge -Hep B surface antigen--pending at time of discharge -Hep C antibody--pending at time of discharge -RPR--pending at time of discharge    Discharge Instructions  Discharge Instructions    Diet general    Complete by:  As directed    Increase activity slowly    Complete by:  As directed      Allergies as of 05/29/2016   No Known Allergies     Medication List    STOP taking these medications   zolpidem 5 MG tablet Commonly known as:  AMBIEN     TAKE these medications   ALPRAZolam 0.5 MG tablet Commonly known as:  XANAX Take 0.5 mg by mouth 4 (four) times daily.   amoxicillin-clavulanate 875-125 MG tablet Commonly known as:  AUGMENTIN Take 1 tablet by mouth every 12 (twelve) hours.   diclofenac sodium 1 % Gel Commonly known as:  VOLTAREN Place 1 application onto the skin 4 (four) times daily as needed.   escitalopram 20 MG tablet Commonly known as:  LEXAPRO Take 20 mg by mouth daily.       No Known  Allergies  Consultations:  Psychiatry   Procedures/Studies: Ct Head Wo Contrast  Result Date: 05/28/2016 CLINICAL DATA:  Found unresponsive. EXAM: CT HEAD WITHOUT CONTRAST TECHNIQUE: Contiguous axial images were obtained from the base of the skull through the vertex without intravenous contrast. COMPARISON:  None. FINDINGS: Brain: There is no intracranial hemorrhage, mass or evidence of acute infarction. There is no extra-axial fluid collection. Gray matter and white matter appear normal. Cerebral volume is normal for age. Brainstem and posterior fossa are unremarkable. The CSF spaces appear normal. Vascular: No hyperdense vessel or unexpected calcification. Skull: Normal. Negative for fracture or focal lesion. Sinuses/Orbits: Air-fluid levels in the maxillary sinuses bilaterally. Other: None. IMPRESSION: 1. Normal brain 2. Maxillary sinus air-fluid levels bilaterally. Electronically Signed   By: Ellery Plunkaniel R Mitchell M.D.   On: 05/28/2016 21:49   Dg Chest Portable 1 View  Result Date: 05/28/2016 CLINICAL DATA:  NG TUBE PLACEMENT, PATIENT FOUND UNRESPONSIVE BY DEPUTY WHEN SERVED WITH IVC PAPERS, EMS WAS CALLED, PATIENT HAS A HISTORY OF DRUG ABUSE, BIPOLAR 1 DISORDER, SUICIDE ATTEMPT. EXAM: PORTABLE CHEST 1 VIEW COMPARISON:  05/28/2016 FINDINGS: Endotracheal tube is in place, tip proximally 4.5 cm above the carina. A nasogastric tube has been placed, tip overlying the level of the stomach. Lungs are clear. IMPRESSION: Interval placement of nasogastric tube. Electronically Signed   By: Norva PavlovElizabeth  Brown M.D.   On: 05/28/2016 21:36   Dg Chest Portable 1 View  Result Date: 05/28/2016 CLINICAL DATA:  Status post intubation. Found unresponsive by Designer, television/film setlaw enforcement officers when they were attempting to serve IVC papers. Pt had made threats of suicide. EXAM: PORTABLE CHEST 1 VIEW COMPARISON:  None. FINDINGS: Endotracheal tube is  in place with tip 4.8 cm above the carina. Heart size is normal. Lungs are clear.  Patient is slightly rotated towards the right. IMPRESSION: Endotracheal tube in good position. No evidence for acute cardiopulmonary abnormality. Electronically Signed   By: Norva Pavlov M.D.   On: 05/28/2016 21:05        Discharge Exam: Vitals:   05/29/16 0600 05/29/16 0737  BP: 117/73   Pulse: 89 92  Resp: 16 16  Temp:  98.4 F (36.9 C)   Vitals:   05/29/16 0500 05/29/16 0600 05/29/16 0737 05/29/16 0842  BP: 117/72 117/73    Pulse: 92 89 92   Resp: 16 16 16    Temp:   98.4 F (36.9 C)   TempSrc:   Axillary   SpO2: 99% 99% 97% 100%  Weight:      Height:        General: Pt is alert, awake, not in acute distress Cardiovascular: RRR, S1/S2 +, no rubs, no gallops Respiratory: CTA bilaterally, no wheezing, no rhonchi Abdominal: Soft, NT, ND, bowel sounds + Extremities: no edema, no cyanosis   The results of significant diagnostics from this hospitalization (including imaging, microbiology, ancillary and laboratory) are listed below for reference.    Significant Diagnostic Studies: Ct Head Wo Contrast  Result Date: 05/28/2016 CLINICAL DATA:  Found unresponsive. EXAM: CT HEAD WITHOUT CONTRAST TECHNIQUE: Contiguous axial images were obtained from the base of the skull through the vertex without intravenous contrast. COMPARISON:  None. FINDINGS: Brain: There is no intracranial hemorrhage, mass or evidence of acute infarction. There is no extra-axial fluid collection. Gray matter and white matter appear normal. Cerebral volume is normal for age. Brainstem and posterior fossa are unremarkable. The CSF spaces appear normal. Vascular: No hyperdense vessel or unexpected calcification. Skull: Normal. Negative for fracture or focal lesion. Sinuses/Orbits: Air-fluid levels in the maxillary sinuses bilaterally. Other: None. IMPRESSION: 1. Normal brain 2. Maxillary sinus air-fluid levels bilaterally. Electronically Signed   By: Ellery Plunk M.D.   On: 05/28/2016 21:49   Dg Chest  Portable 1 View  Result Date: 05/28/2016 CLINICAL DATA:  NG TUBE PLACEMENT, PATIENT FOUND UNRESPONSIVE BY DEPUTY WHEN SERVED WITH IVC PAPERS, EMS WAS CALLED, PATIENT HAS A HISTORY OF DRUG ABUSE, BIPOLAR 1 DISORDER, SUICIDE ATTEMPT. EXAM: PORTABLE CHEST 1 VIEW COMPARISON:  05/28/2016 FINDINGS: Endotracheal tube is in place, tip proximally 4.5 cm above the carina. A nasogastric tube has been placed, tip overlying the level of the stomach. Lungs are clear. IMPRESSION: Interval placement of nasogastric tube. Electronically Signed   By: Norva Pavlov M.D.   On: 05/28/2016 21:36   Dg Chest Portable 1 View  Result Date: 05/28/2016 CLINICAL DATA:  Status post intubation. Found unresponsive by Designer, television/film set when they were attempting to serve IVC papers. Pt had made threats of suicide. EXAM: PORTABLE CHEST 1 VIEW COMPARISON:  None. FINDINGS: Endotracheal tube is in place with tip 4.8 cm above the carina. Heart size is normal. Lungs are clear. Patient is slightly rotated towards the right. IMPRESSION: Endotracheal tube in good position. No evidence for acute cardiopulmonary abnormality. Electronically Signed   By: Norva Pavlov M.D.   On: 05/28/2016 21:05     Microbiology: No results found for this or any previous visit (from the past 240 hour(s)).   Labs: Basic Metabolic Panel:  Recent Labs Lab 05/28/16 2029 05/29/16 0433 05/29/16 1035  NA 144 145  --   K 3.1* 3.2*  --   CL 107 111  --  CO2 27 21*  --   GLUCOSE 113* 126*  --   BUN 8 6  --   CREATININE 1.00 0.79  --   CALCIUM 8.8* 7.8*  --   MG  --   --  1.7   Liver Function Tests:  Recent Labs Lab 05/28/16 2029 05/29/16 0433  AST 31 18  ALT 33 23  ALKPHOS 86 60  BILITOT 0.4 0.4  PROT 9.0* 6.5  ALBUMIN 5.3* 3.9   No results for input(s): LIPASE, AMYLASE in the last 168 hours. No results for input(s): AMMONIA in the last 168 hours. CBC:  Recent Labs Lab 05/28/16 2029 05/29/16 0433  WBC 7.5 8.4  NEUTROABS 3.3   --   HGB 19.6* 15.4  HCT 53.8* 42.6  MCV 90.9 89.9  PLT 305 274   Cardiac Enzymes:  Recent Labs Lab 05/28/16 2029 05/28/16 2121 05/29/16 1035  CKTOTAL 92  --   --   TROPONINI  --  <0.03 <0.03   BNP: Invalid input(s): POCBNP CBG: No results for input(s): GLUCAP in the last 168 hours.  Time coordinating discharge:  Greater than 30 minutes  Signed:  Jazmine Heckman, DO [facility] Pager: [phone] 05/29/2016, 12:19 PM

## 2016-05-29 NOTE — Consult Note (Addendum)
Consult requested by: [facility] Consult requested for: Respiratory failure  HPI: This is a 21 year old who has bipolar disease and previous suicide attempt and who was found unresponsive by law enforcement when they went to serve involuntary commitment papers. He was brought to the emergency department and was intubated for airway protection. It appears that he aspirated. He is being treated for aspiration pneumonia. History is from the medical record because he is intubated and sedated and there is no family at bedside.  Past Medical History:  Diagnosis Date  . Bipolar 1 disorder (HCC)   . Drug abuse   . Suicide attempt      History reviewed. No pertinent family history.   Social History   Social History  . Marital status: Single    Spouse name: N/A  . Number of children: N/A  . Years of education: N/A   Social History Main Topics  . Smoking status: Unknown If Ever Smoked  . Smokeless tobacco: Never Used  . Alcohol use Yes  . Drug use: Yes  . Sexual activity: Not Asked   Other Topics Concern  . None   Social History Narrative  . None     ROS: Not obtainable    Objective: Vital signs in last 24 hours: Temp:  [96.6 F (35.9 C)-98.4 F (36.9 C)] 98.4 F (36.9 C) (02/04 0737) Pulse Rate:  [83-129] 92 (02/04 0737) Resp:  [14-25] 16 (02/04 0737) BP: (117-142)/(72-108) 117/73 (02/04 0600) SpO2:  [95 %-100 %] 100 % (02/04 0842) FiO2 (%):  [30 %-40 %] 30 % (02/04 0842) Weight:  [77.1 kg (170 lb)-81 kg (178 lb 9.2 oz)] 81 kg (178 lb 9.2 oz) (02/04 0045) Weight change:     Intake/Output from previous day: 02/03 0701 - 02/04 0700 In: 2003.9 [I.V.:873.9; NG/GT:30; IV Piggyback:1100] Out: 2500 [Urine:2450; Emesis/NG output:50]  PHYSICAL EXAM Constitutional: He is intubated and sedated. Rather thin. Eyes: He does open his eyes and his pupils react. Ears nose mouth and throat: He has endotracheal tube in place. Mucous membranes are moist. Cardiovascular: His  heart is regular with normal heart sounds. No edema. Respiratory: He is intubated sedated on mechanical ventilation. Lungs show bilateral rhonchi. Gastrointestinal: His abdomen is soft with no masses bowel sounds are present musculoskeletal: Unable to assess neurological: Unable to assess psychiatric: Unable to assess skin: Warm and dry  Lab Results: Basic Metabolic Panel:  Recent Labs  16/10/96 2029 05/29/16 0433  NA 144 145  K 3.1* 3.2*  CL 107 111  CO2 27 21*  GLUCOSE 113* 126*  BUN 8 6  CREATININE 1.00 0.79  CALCIUM 8.8* 7.8*   Liver Function Tests:  Recent Labs  05/28/16 2029 05/29/16 0433  AST 31 18  ALT 33 23  ALKPHOS 86 60  BILITOT 0.4 0.4  PROT 9.0* 6.5  ALBUMIN 5.3* 3.9   No results for input(s): LIPASE, AMYLASE in the last 72 hours. No results for input(s): AMMONIA in the last 72 hours. CBC:  Recent Labs  05/28/16 2029 05/29/16 0433  WBC 7.5 8.4  NEUTROABS 3.3  --   HGB 19.6* 15.4  HCT 53.8* 42.6  MCV 90.9 89.9  PLT 305 274   Cardiac Enzymes:  Recent Labs  05/28/16 2029 05/28/16 2121  CKTOTAL 92  --   TROPONINI  --  <0.03   BNP: No results for input(s): PROBNP in the last 72 hours. D-Dimer: No results for input(s): DDIMER in the last 72 hours. CBG: No results for input(s): GLUCAP in the  last 72 hours. Hemoglobin A1C: No results for input(s): HGBA1C in the last 72 hours. Fasting Lipid Panel: No results for input(s): CHOL, HDL, LDLCALC, TRIG, CHOLHDL, LDLDIRECT in the last 72 hours. Thyroid Function Tests: No results for input(s): TSH, T4TOTAL, FREET4, T3FREE, THYROIDAB in the last 72 hours. Anemia Panel: No results for input(s): VITAMINB12, FOLATE, FERRITIN, TIBC, IRON, RETICCTPCT in the last 72 hours. Coagulation: No results for input(s): LABPROT, INR in the last 72 hours. Urine Drug Screen: Drugs of Abuse     Component Value Date/Time   LABOPIA NONE DETECTED 05/28/2016 2121   COCAINSCRNUR NONE DETECTED 05/28/2016 2121   LABBENZ  NONE DETECTED 05/28/2016 2121   AMPHETMU NONE DETECTED 05/28/2016 2121   THCU NONE DETECTED 05/28/2016 2121   LABBARB NONE DETECTED 05/28/2016 2121    Alcohol Level:  Recent Labs  05/28/16 2029  ETH 585*   Urinalysis:  Recent Labs  05/28/16 2121  COLORURINE STRAW*  LABSPEC 1.006  PHURINE 6.0  GLUCOSEU NEGATIVE  HGBUR NEGATIVE  BILIRUBINUR NEGATIVE  KETONESUR NEGATIVE  PROTEINUR NEGATIVE  NITRITE NEGATIVE  LEUKOCYTESUR NEGATIVE   Misc. Labs:   ABGS:  Recent Labs  05/28/16 2115  PHART 7.348*  PO2ART 188.0*  HCO3 22.0     MICROBIOLOGY: No results found for this or any previous visit (from the past 240 hour(s)).  Studies/Results: Ct Head Wo Contrast  Result Date: 05/28/2016 CLINICAL DATA:  Found unresponsive. EXAM: CT HEAD WITHOUT CONTRAST TECHNIQUE: Contiguous axial images were obtained from the base of the skull through the vertex without intravenous contrast. COMPARISON:  None. FINDINGS: Brain: There is no intracranial hemorrhage, mass or evidence of acute infarction. There is no extra-axial fluid collection. Gray matter and white matter appear normal. Cerebral volume is normal for age. Brainstem and posterior fossa are unremarkable. The CSF spaces appear normal. Vascular: No hyperdense vessel or unexpected calcification. Skull: Normal. Negative for fracture or focal lesion. Sinuses/Orbits: Air-fluid levels in the maxillary sinuses bilaterally. Other: None. IMPRESSION: 1. Normal brain 2. Maxillary sinus air-fluid levels bilaterally. Electronically Signed   By: Ellery Plunkaniel R Mitchell M.D.   On: 05/28/2016 21:49   Dg Chest Portable 1 View  Result Date: 05/28/2016 CLINICAL DATA:  NG TUBE PLACEMENT, PATIENT FOUND UNRESPONSIVE BY DEPUTY WHEN SERVED WITH IVC PAPERS, EMS WAS CALLED, PATIENT HAS A HISTORY OF DRUG ABUSE, BIPOLAR 1 DISORDER, SUICIDE ATTEMPT. EXAM: PORTABLE CHEST 1 VIEW COMPARISON:  05/28/2016 FINDINGS: Endotracheal tube is in place, tip proximally 4.5 cm above the  carina. A nasogastric tube has been placed, tip overlying the level of the stomach. Lungs are clear. IMPRESSION: Interval placement of nasogastric tube. Electronically Signed   By: Norva PavlovElizabeth  Brown M.D.   On: 05/28/2016 21:36   Dg Chest Portable 1 View  Result Date: 05/28/2016 CLINICAL DATA:  Status post intubation. Found unresponsive by Designer, television/film setlaw enforcement officers when they were attempting to serve IVC papers. Pt had made threats of suicide. EXAM: PORTABLE CHEST 1 VIEW COMPARISON:  None. FINDINGS: Endotracheal tube is in place with tip 4.8 cm above the carina. Heart size is normal. Lungs are clear. Patient is slightly rotated towards the right. IMPRESSION: Endotracheal tube in good position. No evidence for acute cardiopulmonary abnormality. Electronically Signed   By: Norva PavlovElizabeth  Brown M.D.   On: 05/28/2016 21:05    Medications:  Prior to Admission:  Prescriptions Prior to Admission  Medication Sig Dispense Refill Last Dose  . ALPRAZolam (XANAX) 0.5 MG tablet Take 0.5 mg by mouth 4 (four) times daily.     .Marland Kitchen  diclofenac sodium (VOLTAREN) 1 % GEL Place 1 application onto the skin 4 (four) times daily as needed.     Marland Kitchen escitalopram (LEXAPRO) 20 MG tablet Take 20 mg by mouth daily.     Marland Kitchen zolpidem (AMBIEN) 5 MG tablet Take 5 mg by mouth at bedtime.      Scheduled: . ampicillin-sulbactam (UNASYN) IV  1.5 g Intravenous Q6H  . citalopram  20 mg Oral Daily  . enoxaparin (LOVENOX) injection  40 mg Subcutaneous Q24H  . famotidine (PEPCID) IV  20 mg Intravenous Q12H  . potassium chloride  10 mEq Intravenous Q1 Hr x 3  . sodium chloride flush  3 mL Intravenous Q12H  . thiamine injection  100 mg Intravenous Daily   Continuous: . dextrose 5 % and 0.9% NaCl 1,000 mL with potassium chloride 40 mEq infusion     PRN:  Assesment:He was admitted with acute respiratory failure presumed aspiration pneumonia alcohol intoxication acute encephalopathy from alcohol intoxication and perhaps from other drugs suicidal  behavior. He remains intubated and sedated. I think we can make an attempt to see if he can come off the ventilator today. He is not known to have any lung disease per medical record but also per the medical record he does smoke cigarettes Principal Problem:   Alcohol intoxication (HCC) Active Problems:   Bipolar 1 disorder (HCC)   Polysubstance abuse   Suicidal behavior with attempted self-injury   Involuntary commitment   Aspiration pneumonia (HCC)   Alcoholic intoxication with complication (HCC)   Acute encephalopathy   Acute respiratory failure (HCC)   Hypokalemia    Plan: Attempt weaning today Addendum: Just after I finished dictating my notes he self extubated. He is very agitated. His heart rate is up but he's shouting so I think he's probably okay from a respiratory point of view but he'll need to be watched carefully He is agitated and belligerent and Dr. Rosaland Lao ordered Haldol. I'm going to cancel the chest x-ray at least for now since he is so belligerent  LOS: 1 day   Richard Larson L 05/29/2016, 9:03 AM

## 2016-05-29 NOTE — Progress Notes (Signed)
PROGRESS NOTE  Richard Larson ZOX:[id number] DOB: 1995/09/11 DOA: 05/28/2016 PCP: WHITE, Bonnell Public, NP  Brief History:  21 year old male with a history of bipolar disorder noncompliant with medications presented to the emergency department unresponsive. History is obtained from medical record and the previous triage notes. Apparently, the patient had an argument with his father with whom he lives earlier in the day. Patient made statements that he would kill himself, or kill someone else and place his their head on the father's porch. Law enforcement went to pt's house to serve IVC papers, but patient was found unresponsive next to a pool of vomit and a fifth bottle of an unknown alcohol beside him. Narcan was given with no response. The patient was intubated in the emergency department protect his airway. Initial blood work showed alcohol level 585.  Besides potassium of 3.1, his BMP, LFTs and CBC were unremarkable. Chest x-ray was negative. CT of the brain was negative except for air-fluid level in the maxillary sinus. CPK was 92. Urine drug screen, salicylate, acetaminophen was negative.  Assessment/Plan: Acute toxic encephalopathy -Secondary to alcohol intoxication -Urine drug screen negative -Alcohol level 585 -CK 92 -Salicylate and acetaminophen levels negative -Start thiamine -Serum B12 -TSH -personally reviewed EKG--sinus tachycardia, nonspecific ST changes  Suicidal ideation/suicide attempt/bipolar disorder -Patient was IVC by his family -consult telepsychiatry when he is awake and extubated  Acute respiratory failure with hypoxia -Consult critical care to assist with ventilator management -Secondary to alcohol intoxication -H2 blockade prophylaxis -personally reviewed CXR--lungs clear -start enteral feedings if not able to wean to extubation in next 48 hours  Aspiration event/aspiration pneumonitis -Patient was found next to pool of vomitus -Continue  Unasyn  Hypokalemia -replete -check mag  Polysubstance abuse -Etoh and tobacco -thiamine  Acute Maxillary Sinusitis -continue unasyn for now  Health maintenance -HIV antibody -Hep B surface antigen -Hep C antibody -RPR    Disposition Plan:   Remain in ICU intubated Family Communication:  No Family at bedside  Consultants:  Pulm/critical care  Code Status:  FULL   DVT Prophylaxis:  [place] Heparin / [place] Lovenox  Total time spent 35 minutes.  Greater than 50% spent face to face counseling and coordinating care.    Procedures: As Listed in Progress Note Above  Antibiotics: None    Subjective: Patient is sedated on the ventilator. No respiratory distress noted. No reports of vomiting, or diarrhea, or uncontrolled pain. Review of systems not possible secondary to sedation.  Objective: Vitals:   05/29/16 0300 05/29/16 0400 05/29/16 0500 05/29/16 0600  BP: 123/84 119/74 117/72 117/73  Pulse: 93 90 92 89  Resp: 16 16 16 16   Temp:      TempSrc:      SpO2: 100% 95% 99% 99%  Weight:      Height:        Intake/Output Summary (Last 24 hours) at 05/29/16 0715 Last data filed at 05/29/16 0600  Gross per 24 hour  Intake          1851.08 ml  Output             2500 ml  Net          -648.92 ml   Weight change:  Exam:   General:  Pt is Sedated on the ventilator. not in acute distress  HEENT: No icterus, No thrush, No neck mass, [place]/AT  Cardiovascular: RRR, S1/S2, no rubs, no gallops  Respiratory: CTA bilaterally, no wheezing, no  crackles, no rhonchi  Abdomen: Soft/+BS,non distended, no guarding  Extremities: No edema, No lymphangitis, No petechiae, No rashes, no synovitis   Data Reviewed: I have personally reviewed following labs and imaging studies Basic Metabolic Panel:  Recent Labs Lab 05/28/16 2029 05/29/16 0433  NA 144 145  K 3.1* 3.2*  CL 107 111  CO2 27 21*  GLUCOSE 113* 126*  BUN 8 6  CREATININE 1.00 0.79  CALCIUM 8.8* 7.8*   Liver  Function Tests:  Recent Labs Lab 05/28/16 2029 05/29/16 0433  AST 31 18  ALT 33 23  ALKPHOS 86 60  BILITOT 0.4 0.4  PROT 9.0* 6.5  ALBUMIN 5.3* 3.9   No results for input(s): LIPASE, AMYLASE in the last 168 hours. No results for input(s): AMMONIA in the last 168 hours. Coagulation Profile: No results for input(s): INR, PROTIME in the last 168 hours. CBC:  Recent Labs Lab 05/28/16 2029 05/29/16 0433  WBC 7.5 8.4  NEUTROABS 3.3  --   HGB 19.6* 15.4  HCT 53.8* 42.6  MCV 90.9 89.9  PLT 305 274   Cardiac Enzymes:  Recent Labs Lab 05/28/16 2029 05/28/16 2121  CKTOTAL 92  --   TROPONINI  --  <0.03   BNP: Invalid input(s): POCBNP CBG: No results for input(s): GLUCAP in the last 168 hours. HbA1C: No results for input(s): HGBA1C in the last 72 hours. Urine analysis:    Component Value Date/Time   COLORURINE STRAW (A) 05/28/2016 2121   APPEARANCEUR CLEAR 05/28/2016 2121   LABSPEC 1.006 05/28/2016 2121   PHURINE 6.0 05/28/2016 2121   GLUCOSEU NEGATIVE 05/28/2016 2121   HGBUR NEGATIVE 05/28/2016 2121   BILIRUBINUR NEGATIVE 05/28/2016 2121   KETONESUR NEGATIVE 05/28/2016 2121   PROTEINUR NEGATIVE 05/28/2016 2121   NITRITE NEGATIVE 05/28/2016 2121   LEUKOCYTESUR NEGATIVE 05/28/2016 2121   Sepsis Labs: @LABRCNTIP (procalcitonin:4,lacticidven:4) )No results found for this or any previous visit (from the past 240 hour(s)).   Scheduled Meds: . ampicillin-sulbactam (UNASYN) IV  1.5 g Intravenous Q6H  . enoxaparin (LOVENOX) injection  40 mg Subcutaneous Q24H  . famotidine (PEPCID) IV  20 mg Intravenous Q12H  . sodium chloride flush  3 mL Intravenous Q12H   Continuous Infusions: . dextrose 5 % and 0.9% NaCl 125 mL/hr at 05/29/16 0123    Procedures/Studies: Ct Head Wo Contrast  Result Date: 05/28/2016 CLINICAL DATA:  Found unresponsive. EXAM: CT HEAD WITHOUT CONTRAST TECHNIQUE: Contiguous axial images were obtained from the base of the skull through the vertex  without intravenous contrast. COMPARISON:  None. FINDINGS: Brain: There is no intracranial hemorrhage, mass or evidence of acute infarction. There is no extra-axial fluid collection. Gray matter and white matter appear normal. Cerebral volume is normal for age. Brainstem and posterior fossa are unremarkable. The CSF spaces appear normal. Vascular: No hyperdense vessel or unexpected calcification. Skull: Normal. Negative for fracture or focal lesion. Sinuses/Orbits: Air-fluid levels in the maxillary sinuses bilaterally. Other: None. IMPRESSION: 1. Normal brain 2. Maxillary sinus air-fluid levels bilaterally. Electronically Signed   By: Ellery Plunk M.D.   On: 05/28/2016 21:49   Dg Chest Portable 1 View  Result Date: 05/28/2016 CLINICAL DATA:  NG TUBE PLACEMENT, PATIENT FOUND UNRESPONSIVE BY DEPUTY WHEN SERVED WITH IVC PAPERS, EMS WAS CALLED, PATIENT HAS A HISTORY OF DRUG ABUSE, BIPOLAR 1 DISORDER, SUICIDE ATTEMPT. EXAM: PORTABLE CHEST 1 VIEW COMPARISON:  05/28/2016 FINDINGS: Endotracheal tube is in place, tip proximally 4.5 cm above the carina. A nasogastric tube has been placed, tip overlying the level  of the stomach. Lungs are clear. IMPRESSION: Interval placement of nasogastric tube. Electronically Signed   By: Norva PavlovElizabeth  Brown M.D.   On: 05/28/2016 21:36   Dg Chest Portable 1 View  Result Date: 05/28/2016 CLINICAL DATA:  Status post intubation. Found unresponsive by Designer, television/film setlaw enforcement officers when they were attempting to serve IVC papers. Pt had made threats of suicide. EXAM: PORTABLE CHEST 1 VIEW COMPARISON:  None. FINDINGS: Endotracheal tube is in place with tip 4.8 cm above the carina. Heart size is normal. Lungs are clear. Patient is slightly rotated towards the right. IMPRESSION: Endotracheal tube in good position. No evidence for acute cardiopulmonary abnormality. Electronically Signed   By: Norva PavlovElizabeth  Brown M.D.   On: 05/28/2016 21:05    Nycere Presley, DO  [facility] Pager  [phone]  If 7PM-7AM, please contact night-coverage [email or web address] Password [email or web address] 05/29/2016, 7:15 AM   LOS: 1 day

## 2016-05-29 NOTE — Progress Notes (Signed)
At approximately 0905 patient self-extubated. He was able to speak and did not appear in respiratory distress. He was combative and very agitated vitals were not able to be obtained.

## 2016-05-29 NOTE — Progress Notes (Signed)
[facility] Day shift RN [facility] for bed [facility]. Pt be be under service of Dr. Jama Flavorsobos.

## 2016-05-30 ENCOUNTER — Inpatient Hospital Stay (HOSPITAL_COMMUNITY)
Admission: AD | Admit: 2016-05-30 | Discharge: 2016-06-02 | DRG: 897 | Disposition: A | Discharge status: Home / Self Care | Payer: 59 | Attending: Psychiatry | Admitting: Psychiatry

## 2016-05-30 ENCOUNTER — Encounter (HOSPITAL_COMMUNITY): Payer: Self-pay | Admitting: *Deleted

## 2016-05-30 DIAGNOSIS — F1024 Alcohol dependence with alcohol-induced mood disorder: Principal | ICD-10-CM | POA: Diagnosis present

## 2016-05-30 DIAGNOSIS — F17210 Nicotine dependence, cigarettes, uncomplicated: Secondary | ICD-10-CM | POA: Diagnosis present

## 2016-05-30 DIAGNOSIS — Z23 Encounter for immunization: Secondary | ICD-10-CM

## 2016-05-30 DIAGNOSIS — F10929 Alcohol use, unspecified with intoxication, unspecified: Secondary | ICD-10-CM | POA: Diagnosis present

## 2016-05-30 DIAGNOSIS — Z9114 Patient's other noncompliance with medication regimen: Secondary | ICD-10-CM

## 2016-05-30 DIAGNOSIS — F319 Bipolar disorder, unspecified: Secondary | ICD-10-CM | POA: Diagnosis present

## 2016-05-30 DIAGNOSIS — G4700 Insomnia, unspecified: Secondary | ICD-10-CM | POA: Diagnosis present

## 2016-05-30 DIAGNOSIS — J9601 Acute respiratory failure with hypoxia: Secondary | ICD-10-CM

## 2016-05-30 DIAGNOSIS — Z79899 Other long term (current) drug therapy: Secondary | ICD-10-CM

## 2016-05-30 DIAGNOSIS — F1014 Alcohol abuse with alcohol-induced mood disorder: Secondary | ICD-10-CM | POA: Diagnosis present

## 2016-05-30 DIAGNOSIS — F411 Generalized anxiety disorder: Secondary | ICD-10-CM | POA: Diagnosis present

## 2016-05-30 DIAGNOSIS — Z818 Family history of other mental and behavioral disorders: Secondary | ICD-10-CM

## 2016-05-30 DIAGNOSIS — R45851 Suicidal ideations: Secondary | ICD-10-CM | POA: Diagnosis present

## 2016-05-30 DIAGNOSIS — Z046 Encounter for general psychiatric examination, requested by authority: Secondary | ICD-10-CM

## 2016-05-30 DIAGNOSIS — G9340 Encephalopathy, unspecified: Secondary | ICD-10-CM

## 2016-05-30 DIAGNOSIS — R45850 Homicidal ideations: Secondary | ICD-10-CM | POA: Diagnosis present

## 2016-05-30 LAB — CBC
HCT: 38.3 % — ABNORMAL LOW (ref 39.0–52.0)
Hemoglobin: 13.2 g/dL (ref 13.0–17.0)
MCH: 31.5 pg (ref 26.0–34.0)
MCHC: 34.5 g/dL (ref 30.0–36.0)
MCV: 91.4 fL (ref 78.0–100.0)
PLATELETS: 181 10*3/uL (ref 150–400)
RBC: 4.19 MIL/uL — ABNORMAL LOW (ref 4.22–5.81)
RDW: 12.9 % (ref 11.5–15.5)
WBC: 7.1 10*3/uL (ref 4.0–10.5)

## 2016-05-30 LAB — BASIC METABOLIC PANEL
Anion gap: 7 (ref 5–15)
BUN: 9 mg/dL (ref 6–20)
CALCIUM: 8.7 mg/dL — AB (ref 8.9–10.3)
CO2: 25 mmol/L (ref 22–32)
Chloride: 105 mmol/L (ref 101–111)
Creatinine, Ser: 0.95 mg/dL (ref 0.61–1.24)
GFR calc Af Amer: >60 mL/min (ref >60)
GFR calc non Af Amer: >60 mL/min (ref >60)
GLUCOSE: 103 mg/dL — AB (ref 65–99)
Potassium: 3.5 mmol/L (ref 3.5–5.1)
Sodium: 137 mmol/L (ref 135–145)

## 2016-05-30 LAB — GLUCOSE, CAPILLARY: GLUCOSE-CAPILLARY: 127 mg/dL — AB (ref 65–99)

## 2016-05-30 LAB — MAGNESIUM: MAGNESIUM: 1.7 mg/dL (ref 1.7–2.4)

## 2016-05-30 MED ORDER — ACETAMINOPHEN 325 MG PO TABS
650.0000 mg | ORAL_TABLET | Freq: Four times a day (QID) | ORAL | Status: DC | PRN
  Administered 2016-05-31 – 2016-06-01 (×3): 650 mg via ORAL
  Filled 2016-05-30 (×3): qty 2

## 2016-05-30 MED ORDER — HYDROXYZINE HCL 25 MG PO TABS: 25.0000 mg | ORAL_TABLET | Freq: Four times a day (QID) | ORAL | Status: DC | PRN

## 2016-05-30 MED ORDER — ALUM & MAG HYDROXIDE-SIMETH 200-200-20 MG/5ML PO SUSP: 30.0000 mL | ORAL | Status: DC | PRN

## 2016-05-30 MED ORDER — PNEUMOCOCCAL VAC POLYVALENT 25 MCG/0.5ML IJ INJ
0.5000 mL | INJECTION | INTRAMUSCULAR | Status: AC
  Administered 2016-05-31: 0.5 mL via INTRAMUSCULAR

## 2016-05-30 MED ORDER — AMOXICILLIN-POT CLAVULANATE 875-125 MG PO TABS: 1.0000 | ORAL_TABLET | Freq: Two times a day (BID) | ORAL | 0 refills | Status: DC

## 2016-05-30 MED ORDER — NICOTINE 21 MG/24HR TD PT24
21.0000 mg | MEDICATED_PATCH | Freq: Every day | TRANSDERMAL | Status: DC
  Administered 2016-05-30 – 2016-06-02 (×4): 21 mg via TRANSDERMAL
  Filled 2016-05-30 (×7): qty 1

## 2016-05-30 MED ORDER — ESCITALOPRAM OXALATE 20 MG PO TABS
20.0000 mg | ORAL_TABLET | Freq: Every day | ORAL | Status: DC
  Administered 2016-05-31 – 2016-06-02 (×3): 20 mg via ORAL
  Filled 2016-05-30 (×5): qty 1

## 2016-05-30 MED ORDER — AMOXICILLIN-POT CLAVULANATE 875-125 MG PO TABS
1.0000 | ORAL_TABLET | Freq: Two times a day (BID) | ORAL | Status: DC
Start: 2016-05-30 — End: 2016-06-02
  Administered 2016-05-30 – 2016-06-02 (×6): 1 via ORAL
  Filled 2016-05-30 (×11): qty 1

## 2016-05-30 MED ORDER — MAGNESIUM HYDROXIDE 400 MG/5ML PO SUSP: 30.0000 mL | Freq: Every day | ORAL | Status: DC | PRN

## 2016-05-30 MED ORDER — TRAZODONE HCL 50 MG PO TABS
50.0000 mg | ORAL_TABLET | Freq: Every evening | ORAL | Status: DC | PRN
  Administered 2016-05-30: 50 mg via ORAL
  Filled 2016-05-30: qty 1

## 2016-05-30 MED ORDER — METOPROLOL TARTRATE 25 MG PO TABS: 12.5000 mg | ORAL_TABLET | Freq: Two times a day (BID) | ORAL | 0 refills | Status: DC

## 2016-05-30 MED ORDER — ALPRAZOLAM 0.5 MG PO TABS
0.5000 mg | ORAL_TABLET | Freq: Four times a day (QID) | ORAL | Status: DC
  Administered 2016-05-30 – 2016-05-31 (×5): 0.5 mg via ORAL
  Filled 2016-05-30 (×5): qty 1

## 2016-05-30 NOTE — Discharge Summary (Signed)
Physician Discharge Summary  Ty Buntrock ZOX:[id number] DOB: 11/12/1995 DOA: 05/28/2016  PCP: April Manson, NP  Admit date: 05/28/2016 Discharge date: 05/30/2016  Admitted From: Home Disposition:  Behavioral Health Institution  Recommendations for Outpatient Follow-up:  1. Follow up with PCP in 1-2 weeks 2. Please obtain BMP/CBC in one week 3. Please follow up on HIV antibody, hepatitis B surface antigen, hepatitis C antibody, RPR results   Discharge Condition: Stable CODE STATUS:FULL Diet recommendation: Regular   Brief/Interim Summary: 21 year old male with a history of bipolar disorder noncompliant with medications presented to the emergency department unresponsive. History initially obtained from medical record and the previous triage notes. Apparently, the patient had an argument with his father with whom he lives earlier in the day. Patient made statements that he would kill himself, or kill someone else and place his their head on the father's porch. Law enforcement went to pt'shouse to serve IVC papers, but patient was found unresponsive next to a pool of vomit and a fifth bottle of an unknown alcohol beside him. Narcan was given with no response. The patient was intubated in the emergency department protect his airway. Initial blood work showed alcohol level 585.  Besides potassium of 3.1, his BMP, LFTs and CBC were unremarkable. Chest x-ray was negative. CT of the brain was negative except for air-fluid level in the maxillary sinus. CPK was 92. Urine drug screen, salicylate, acetaminophen was negative. Pulmonary medicine was consulted to see the patient and assist with vent management. Once the patient's sedation was decreased, the patient self extubated. Soon thereafter, the patient became belligerent and agitated threatening harm to the nursing staff and required restraining by police and security. The patient was given Haldol and Ativan with improvement of his behavior. The  patient's vital signs remained stable. He was afebrile and hemodynamically stable with oxygen saturation 100 percent on room air. The patient was reassessed after his behavior improved. He denied any fevers, chills, chest pain, shortness breath, dizziness, coughing, hemoptysis, nausea, vomiting, abdominal pain, dysuria, hematuria. The patient was alert and oriented 4. The patient did become tachycardic with HR 120-140. However, he was asymptomatic. EKG was obtained and showed sinus tachycardia without ST-T wave changes or AV block. The patient was hypokalemic, and this was repleted. The remainder of his CMP and CBC were unremarkable. TSH was 1.423, and troponins were negative 2. The remainder of his blood work including HIV, viral hepatitis serologies, and RPR were ending at the time of discharge. The patient was deemed to be medically stable to be discharged to a behavioral health facility.  During the hospitalization, his father did arrive and stated that the patient did not take any extraneous pills or other illicit substances. His father stated that the patient has a history of binge alcohol drinking. Prior to 05/28/2016, the patient last drank approximately 1 month prior to this admission. His father stated that he instituted the IVC in order to get the patient some help.  Discharge Diagnoses:  Acute toxic encephalopathy -Secondary to alcohol intoxication -Urine drug screen negative -Alcohol level 585 -CK 92 -Salicylate and acetaminophen levels negative -Started thiamine -Serum B12--420 -TSH--1.423 -RPR--NEG -personally reviewed EKG--sinus tachycardia, nonspecific ST changes -improved; remained A&Ox4 after extubation and sedation turned off without withdraw symptoms  Suicidal ideation/suicide attempt/bipolar disorder -Patient was IVC by his family--he was served his IVC by law enforcement once he was deemed medically stable, which he is -consulted telepsychiatry-->needs inpatient  psychiatric care -pt is deemed stable from medical standpoint to go to  behavioral heatlh facility  Acute respiratory failure with hypoxia -Consult critical care assisted with ventilator management -Secondary to alcohol intoxication -H2 blockade prophylaxis -personally reviewed CXR--lungs clear -After extubation, the patient did not have any distress or shortness of breath with oxygen saturation 96-100% on room air.  Aspiration event -Patient was found next to pool of vomitus -Continue Unasyn--initially continued, but this will be treated discontinued at the time of discharge -d/c with augmentin x 5 more days  Hypokalemia -repleted -check mag--1.7  Polysubstance abuse -Etoh and tobacco -thiamine po daily -nicoderm patch  Acute Maxillary Sinusitis -continue unasyn for now--initially continued, but this will be treated discontinued at the time of discharge -d/c with Augmentin x 5 more days  Health maintenance -HIV antibody--pending at time of discharge -Hep B surface antigen--pending at time of discharge -Hep C antibody--pending at time of discharge -RPR--NEG  Sinus tachycardia -due to acute medical condition -asymptomatic -TSH 1.423 -improved with metoprolol tartrate 12.5 mg bid    Discharge Instructions  Discharge Instructions    Diet general    Complete by:  As directed    Diet general    Complete by:  As directed    Increase activity slowly    Complete by:  As directed    Increase activity slowly    Complete by:  As directed      Allergies as of 05/30/2016   No Known Allergies     Medication List    STOP taking these medications   zolpidem 5 MG tablet Commonly known as:  AMBIEN     TAKE these medications   ALPRAZolam 0.5 MG tablet Commonly known as:  XANAX Take 0.5 mg by mouth 4 (four) times daily.   amoxicillin-clavulanate 875-125 MG tablet Commonly known as:  AUGMENTIN Take 1 tablet by mouth every 12 (twelve) hours.   diclofenac  sodium 1 % Gel Commonly known as:  VOLTAREN Place 1 application onto the skin 4 (four) times daily as needed.   escitalopram 20 MG tablet Commonly known as:  LEXAPRO Take 20 mg by mouth daily.   metoprolol tartrate 25 MG tablet Commonly known as:  LOPRESSOR Take 0.5 tablets (12.5 mg total) by mouth 2 (two) times daily.       No Known Allergies  Consultations:  Psychiatry   Procedures/Studies: Ct Head Wo Contrast  Result Date: 05/28/2016 CLINICAL DATA:  Found unresponsive. EXAM: CT HEAD WITHOUT CONTRAST TECHNIQUE: Contiguous axial images were obtained from the base of the skull through the vertex without intravenous contrast. COMPARISON:  None. FINDINGS: Brain: There is no intracranial hemorrhage, mass or evidence of acute infarction. There is no extra-axial fluid collection. Gray matter and white matter appear normal. Cerebral volume is normal for age. Brainstem and posterior fossa are unremarkable. The CSF spaces appear normal. Vascular: No hyperdense vessel or unexpected calcification. Skull: Normal. Negative for fracture or focal lesion. Sinuses/Orbits: Air-fluid levels in the maxillary sinuses bilaterally. Other: None. IMPRESSION: 1. Normal brain 2. Maxillary sinus air-fluid levels bilaterally. Electronically Signed   By: Ellery Plunkaniel R Mitchell M.D.   On: 05/28/2016 21:49   Dg Chest Portable 1 View  Result Date: 05/28/2016 CLINICAL DATA:  NG TUBE PLACEMENT, PATIENT FOUND UNRESPONSIVE BY DEPUTY WHEN SERVED WITH IVC PAPERS, EMS WAS CALLED, PATIENT HAS A HISTORY OF DRUG ABUSE, BIPOLAR 1 DISORDER, SUICIDE ATTEMPT. EXAM: PORTABLE CHEST 1 VIEW COMPARISON:  05/28/2016 FINDINGS: Endotracheal tube is in place, tip proximally 4.5 cm above the carina. A nasogastric tube has been placed, tip overlying the level of the  stomach. Lungs are clear. IMPRESSION: Interval placement of nasogastric tube. Electronically Signed   By: Norva Pavlov M.D.   On: 05/28/2016 21:36   Dg Chest Portable 1  View  Result Date: 05/28/2016 CLINICAL DATA:  Status post intubation. Found unresponsive by Designer, television/film set when they were attempting to serve IVC papers. Pt had made threats of suicide. EXAM: PORTABLE CHEST 1 VIEW COMPARISON:  None. FINDINGS: Endotracheal tube is in place with tip 4.8 cm above the carina. Heart size is normal. Lungs are clear. Patient is slightly rotated towards the right. IMPRESSION: Endotracheal tube in good position. No evidence for acute cardiopulmonary abnormality. Electronically Signed   By: Norva Pavlov M.D.   On: 05/28/2016 21:05        Discharge Exam: Vitals:   05/30/16 0731 05/30/16 1100  BP:    Pulse:    Resp:    Temp: 99.2 F (37.3 C) 98.8 F (37.1 C)   Vitals:   05/30/16 0244 05/30/16 0251 05/30/16 0731 05/30/16 1100  BP: 128/71 127/86    Pulse: 89 87    Resp: (!) 21 20    Temp:   99.2 F (37.3 C) 98.8 F (37.1 C)  TempSrc:   Oral Oral  SpO2: 94% 95%    Weight:      Height:        General: Pt is alert, awake, not in acute distress, A&O x 4 Cardiovascular: RRR, S1/S2 +, no rubs, no gallops Respiratory: CTA bilaterally, no wheezing, no rhonchi Abdominal: Soft, NT, ND, bowel sounds + Extremities: no edema, no cyanosis   The results of significant diagnostics from this hospitalization (including imaging, microbiology, ancillary and laboratory) are listed below for reference.    Significant Diagnostic Studies: Ct Head Wo Contrast  Result Date: 05/28/2016 CLINICAL DATA:  Found unresponsive. EXAM: CT HEAD WITHOUT CONTRAST TECHNIQUE: Contiguous axial images were obtained from the base of the skull through the vertex without intravenous contrast. COMPARISON:  None. FINDINGS: Brain: There is no intracranial hemorrhage, mass or evidence of acute infarction. There is no extra-axial fluid collection. Gray matter and white matter appear normal. Cerebral volume is normal for age. Brainstem and posterior fossa are unremarkable. The CSF spaces  appear normal. Vascular: No hyperdense vessel or unexpected calcification. Skull: Normal. Negative for fracture or focal lesion. Sinuses/Orbits: Air-fluid levels in the maxillary sinuses bilaterally. Other: None. IMPRESSION: 1. Normal brain 2. Maxillary sinus air-fluid levels bilaterally. Electronically Signed   By: Ellery Plunk M.D.   On: 05/28/2016 21:49   Dg Chest Portable 1 View  Result Date: 05/28/2016 CLINICAL DATA:  NG TUBE PLACEMENT, PATIENT FOUND UNRESPONSIVE BY DEPUTY WHEN SERVED WITH IVC PAPERS, EMS WAS CALLED, PATIENT HAS A HISTORY OF DRUG ABUSE, BIPOLAR 1 DISORDER, SUICIDE ATTEMPT. EXAM: PORTABLE CHEST 1 VIEW COMPARISON:  05/28/2016 FINDINGS: Endotracheal tube is in place, tip proximally 4.5 cm above the carina. A nasogastric tube has been placed, tip overlying the level of the stomach. Lungs are clear. IMPRESSION: Interval placement of nasogastric tube. Electronically Signed   By: Norva Pavlov M.D.   On: 05/28/2016 21:36   Dg Chest Portable 1 View  Result Date: 05/28/2016 CLINICAL DATA:  Status post intubation. Found unresponsive by Designer, television/film set when they were attempting to serve IVC papers. Pt had made threats of suicide. EXAM: PORTABLE CHEST 1 VIEW COMPARISON:  None. FINDINGS: Endotracheal tube is in place with tip 4.8 cm above the carina. Heart size is normal. Lungs are clear. Patient is slightly rotated towards the  right. IMPRESSION: Endotracheal tube in good position. No evidence for acute cardiopulmonary abnormality. Electronically Signed   By: Norva Pavlov M.D.   On: 05/28/2016 21:05     Microbiology: No results found for this or any previous visit (from the past 240 hour(s)).   Labs: Basic Metabolic Panel:  Recent Labs Lab 05/28/16 2029 05/29/16 0433 05/29/16 1035 05/30/16 0422  NA 144 145  --  137  K 3.1* 3.2*  --  3.5  CL 107 111  --  105  CO2 27 21*  --  25  GLUCOSE 113* 126*  --  103*  BUN 8 6  --  9  CREATININE 1.00 0.79  --  0.95   CALCIUM 8.8* 7.8*  --  8.7*  MG  --   --  1.7 1.7   Liver Function Tests:  Recent Labs Lab 05/28/16 2029 05/29/16 0433  AST 31 18  ALT 33 23  ALKPHOS 86 60  BILITOT 0.4 0.4  PROT 9.0* 6.5  ALBUMIN 5.3* 3.9   No results for input(s): LIPASE, AMYLASE in the last 168 hours. No results for input(s): AMMONIA in the last 168 hours. CBC:  Recent Labs Lab 05/28/16 2029 05/29/16 0433 05/30/16 0422  WBC 7.5 8.4 7.1  NEUTROABS 3.3  --   --   HGB 19.6* 15.4 13.2  HCT 53.8* 42.6 38.3*  MCV 90.9 89.9 91.4  PLT 305 274 181   Cardiac Enzymes:  Recent Labs Lab 05/28/16 2029 05/28/16 2121 05/29/16 1035  CKTOTAL 92  --   --   TROPONINI  --  <0.03 <0.03   BNP: Invalid input(s): POCBNP CBG:  Recent Labs Lab 05/28/16 2009  GLUCAP 127*    Time coordinating discharge:  Greater than 30 minutes  Signed:  Timia Casselman, DO [facility] Pager: [phone] 05/30/2016, 11:00 AM

## 2016-05-30 NOTE — Progress Notes (Signed)
Deputy here to pick [facility] IV removed and paperwork given to pt. Clothing and pt's wallet were given to deputy. All questions answered. [facility].

## 2016-05-30 NOTE — Progress Notes (Signed)
The patient attended the evening A.A. Meeting and was appropriate. He left the group early and returned to his bedroom.

## 2016-05-30 NOTE — Progress Notes (Signed)
Richard Larson is a 21 year old male being admitted involuntarily to 307-2 from [facility]-med floor.  He was admitted 05/29/16 to the medical floor after being found unresponsive at his home.  There was an empty liquor bottle beside him.  Prior to being found, he made statements of suicidal and homicidal ideation to his father who IVC'd him.  He was medically cleared today.  [facility] assessment reports that this happened after an altercation with his girlfriend about child custody issues.  His BAL on admission was 595.  He currently denied SI/HI or A/V hallucinations.  He does report feeling depressed, anxiety, irritable and poor sleep.  His current stressor are custody issues with his girlfriend.  He is diagnosed with Bipolar Disorder and Alcohol-Induced Mood Disorder.  [facility], he denied that this was a suicide attempt.  He was very focused on when he will be leaving.  He denies any SI/HI or A/V hallucinations.  He repotted that he doesn't drink but every 2 months and doesn't feel like he has a problem.  He denied any drug abuse history.  He denies any medical issues at this time and appear to be in no physical distress.  HE does report a history of back injury from a fall off of the back of a truck.  "I am supposed to have back surgery for protruding discs but I haven't decided to do this yet."  He isn't being treated medically because he [place] his [place].  Oriented him to the unit.  Admission paperwork completed and signed.  Belongings searched and secured in locker # 11.  Skin assessment completed and noted left hand/wrist/lower arm (not hot/no redness) are swollen from recent IV at the hospital.  Q 15 minute checks initiated for safety.  We will monitor the progress towards his goals.

## 2016-05-30 NOTE — Progress Notes (Signed)
He is transferring to behavioral health today. No pulmonary complaints. I will plan to sign off.  Thanks for allowing me to see him with you

## 2016-05-30 NOTE — Tx Team (Signed)
Initial Treatment Plan 05/30/2016 6:06 PM Gwyneth SproutBraxston Laughman WUJ:[id number]    PATIENT STRESSORS: Financial difficulties Marital or family conflict Occupational concerns Other: Recent move to the area (from [place])   PATIENT STRENGTHS: Capable of independent living Wellsite geologistCommunication skills General fund of knowledge Physical Health Supportive family/friends   PATIENT IDENTIFIED PROBLEMS: Depression  Anxiety  Substance abuse  Suicidal/homicidal ideation  "Learning new coping skills"  "I would like to sleep better"           DISCHARGE CRITERIA:  Improved stabilization in mood, thinking, and/or behavior Verbal commitment to aftercare and medication compliance Withdrawal symptoms are absent or subacute and managed without 24-hour nursing intervention  PRELIMINARY DISCHARGE PLAN: Outpatient therapy Medication management  PATIENT/FAMILY INVOLVEMENT: This treatment plan has been presented to and reviewed with the patient, Kaito Maxson.  The patient and family have been given the opportunity to ask questions and make suggestions.  Levin BaconHeather V Aylen Rambert, RN 05/30/2016, 6:06 PM

## 2016-05-31 DIAGNOSIS — F1014 Alcohol abuse with alcohol-induced mood disorder: Secondary | ICD-10-CM

## 2016-05-31 DIAGNOSIS — F10929 Alcohol use, unspecified with intoxication, unspecified: Secondary | ICD-10-CM

## 2016-05-31 DIAGNOSIS — F319 Bipolar disorder, unspecified: Secondary | ICD-10-CM

## 2016-05-31 DIAGNOSIS — Z79899 Other long term (current) drug therapy: Secondary | ICD-10-CM

## 2016-05-31 MED ORDER — TRAZODONE HCL 50 MG PO TABS
50.0000 mg | ORAL_TABLET | Freq: Every evening | ORAL | Status: DC | PRN
  Administered 2016-05-31 – 2016-06-01 (×3): 50 mg via ORAL
  Filled 2016-05-31 (×10): qty 1

## 2016-05-31 MED ORDER — LORAZEPAM 1 MG PO TABS
1.0000 mg | ORAL_TABLET | Freq: Four times a day (QID) | ORAL | Status: DC | PRN
  Administered 2016-05-31 – 2016-06-01 (×2): 1 mg via ORAL
  Filled 2016-05-31 (×4): qty 1

## 2016-05-31 MED ORDER — HYDROXYZINE HCL 25 MG PO TABS
25.0000 mg | ORAL_TABLET | Freq: Four times a day (QID) | ORAL | Status: DC | PRN
  Administered 2016-06-01: 25 mg via ORAL
  Filled 2016-05-31: qty 1

## 2016-05-31 MED ORDER — ADULT MULTIVITAMIN W/MINERALS CH
1.0000 | ORAL_TABLET | Freq: Every day | ORAL | Status: DC
  Administered 2016-05-31 – 2016-06-01 (×2): 1 via ORAL
  Filled 2016-05-31 (×3): qty 1

## 2016-05-31 MED ORDER — LOPERAMIDE HCL 2 MG PO CAPS: 2.0000 mg | ORAL_CAPSULE | ORAL | Status: DC | PRN

## 2016-05-31 MED ORDER — LORAZEPAM 1 MG PO TABS: 1.0000 mg | ORAL_TABLET | Freq: Every day | ORAL | Status: DC

## 2016-05-31 MED ORDER — LORAZEPAM 1 MG PO TABS: 1.0000 mg | ORAL_TABLET | Freq: Four times a day (QID) | ORAL | Status: DC

## 2016-05-31 MED ORDER — THIAMINE HCL 100 MG/ML IJ SOLN
100.0000 mg | Freq: Once | INTRAMUSCULAR | Status: AC
  Administered 2016-05-31: 100 mg via INTRAMUSCULAR
  Filled 2016-05-31: qty 2

## 2016-05-31 MED ORDER — VITAMIN B-1 100 MG PO TABS
100.0000 mg | ORAL_TABLET | Freq: Every day | ORAL | Status: DC
  Administered 2016-06-01: 100 mg via ORAL
  Filled 2016-05-31 (×2): qty 1

## 2016-05-31 MED ORDER — LORAZEPAM 1 MG PO TABS: 1.0000 mg | ORAL_TABLET | Freq: Three times a day (TID) | ORAL | Status: DC

## 2016-05-31 MED ORDER — LORAZEPAM 1 MG PO TABS: 1.0000 mg | ORAL_TABLET | Freq: Two times a day (BID) | ORAL | Status: DC

## 2016-05-31 MED ORDER — ONDANSETRON 4 MG PO TBDP: 4.0000 mg | ORAL_TABLET | Freq: Four times a day (QID) | ORAL | Status: DC | PRN

## 2016-05-31 MED ORDER — TRAZODONE HCL 100 MG PO TABS
100.0000 mg | ORAL_TABLET | Freq: Every day | ORAL | Status: DC
Start: 2016-05-31 — End: 2016-05-31
  Filled 2016-05-31 (×2): qty 1

## 2016-05-31 NOTE — [facility] Counselor (Signed)
Adult Comprehensive Assessment  Patient ID: Richard Larson, male   DOB: [birth year], 21 y.o.   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  Bereavement / Loss: none identified.   Living/Environment/Situation:  Living Arrangements: Parent, Spouse/significant other Living conditions (as described by patient or guardian): lives with gf and father. her son lives with [place] "he's 2" How long has patient lived in current situation?: few years What is atmosphere in current home: Comfortable, Supportive  Family History:  Marital status: Long term relationship (has gf) Long term relationship, how long?: 6 months What types of issues is patient dealing with in the relationship?: no issues reported Additional relationship information: n/a  Are you sexually active?: Yes What is your sexual orientation?: heterosexual Has your sexual activity been affected by drugs, alcohol, medication, or emotional stress?: n/a  Does patient have children?: Yes How many children?: 1 How is patient's relationship with their children?: 64 year old daughter who lives with her mother; "I flew up there to see her in [place]."   Childhood History:  By whom was/is the patient raised?: Both parents Additional childhood history information: [place] from [facility] here with dad 2 1/2 years ago.  Description of patient's relationship with caregiver when they were a child: father; mom-in and out of prison. Patient's description of current relationship with people who raised him/her: "I don't talk to my mom because I don't want to talk to him." "good relationship with dad." How were you disciplined when you got in trouble as a child/adolescent?: n/a Does patient have siblings?: Yes Number of Siblings: 4 Description of patient's current relationship with siblings: 3  brothers and 1 sister. middle child. good relationship; up in [place].  Did patient suffer any verbal/emotional/physical/sexual abuse as a child?: No Did patient suffer from severe childhood neglect?: No Has patient ever been sexually abused/assaulted/raped as an adolescent or adult?: No Was the patient ever a victim of a crime or a disaster?: No Witnessed domestic violence?: No Has patient been effected by domestic violence as an adult?: No  Education:  Highest grade of school patient has completed: 10th grade--"my mom went to jail and I was the only one around to care for my sister." "I had to work to care for my kids." Currently a Consulting civil engineer?: No ("I plan to go back and get my GED") Learning disability?: No  Employment/Work Situation:   Employment situation: Employed Where is patient currently employed?: 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 Has patient ever been in the [employer]?: No Has patient ever served in combat?: No Did You Receive Any Psychiatric Treatment/Services While in the [employer]?: No Are There Guns or Other Weapons in Your Home?: No Are These Weapons Safely Secured?: Yes ("they are locked in my dad's safe. he is the only one with access.")  Financial Resources:   Financial resources: Income from employment, Support from parents / caregiver, Private insurance Does patient have a representative payee or guardian?: No  Alcohol/Substance Abuse:   What has been your use of drugs/alcohol within the last 12 months?: alcohol-pt binge drank prior to admission "I was playing video games  and having fun and drank too much." liquor. "I rarely drink at all." no other drug use.  If attempted suicide, did  drugs/alcohol play a role in this?: No Alcohol/Substance Abuse Treatment Hx: Denies past history, Past Tx, Outpatient If yes, describe treatment: hx of outpatient psychiatry-diagnosed with depression/anxiety. prescribed xanax and lexapro.  Has alcohol/substance abuse ever caused legal problems?: No  Social Support System:   Patient's Community Support System: Good Describe Community Support System: several good friends in community Type of faith/religion: n/a  How does patient's faith help to cope with current illness?: n/a   Leisure/Recreation:   Leisure and Hobbies: video games; drawing; working on Brewing technologistvehicles  Strengths/Needs:   What things does the patient do well?: drawing and working on cars In what areas does patient struggle / problems for patient: "probably need to be more careful with how much you drink."   Discharge Plan:   Does patient have access to transportation?: Yes (dad drives) Will patient be returning to same living situation after discharge?: Yes (return home) Currently receiving community mental health services: Yes (From Whom) If no, would patient like referral for services when discharged?: Yes (What county?) ([place] [place] Does patient have financial barriers related to discharge medications?: No (private insurance.)  Summary/Recommendations:   Summary and Recommendations (to be completed by the evaluator): Patient is 21 yo male [place], [place] with his father, girlfriend, and her 2yo son. Patient presents to the hospital involuntarily due to extreme alcohol intoxication (BAL 585), suicidal/vague homicidal threats, and for medication stabilization. Patient currently denies SI/HI/AVH and reports no prior psychiatric hospitalizations. Patient is employed and has 2yo daughter that lives out of state. Patient [facility] [place] [place] [place] for medication management and has a prior diagnosis  of Bipolar Disorder. patient reports that he was drinking heavily and playing video games, got into argument with his father, and was taken to the hospital. patient reports social drinking occassionally and reports that he does not typically binge drink. Patient denies other substance abuse. Recommendations for patient include: crisis stabilization, therapeutic milieu, encourage group attendance and participation, medication management for mood stabilization, and development of comprehensive mental wellness/sobriety plan. Patient is not interested in referrals for inpatient or SAIOP/psychiatry and is agreable to follow-up being made with his current provider. Patient also gave consent for CSW to contact his father prior to discharge.   Ledell PeoplesHeather N Smart LCSW 05/31/2016 3:55 PM

## 2016-05-31 NOTE — Progress Notes (Signed)
DAR NOTE: Patient presents with calm affect and mood is appropriate to situation. Left arm slightly swollen.  RNP made aware.  Patient encouraged to elevate arm.  Denies auditory and visual hallucinations.  Described energy level as normal and concentration as good.  Patient continues with antibiotic therapy for pneumonia.  No adverse reaction noted.  Reports withdrawal symptoms of chilling, runny nose and nausea on self inventory form.  Rates depression at 0, hopelessness at 0, and anxiety at 3.  Maintained on routine safety checks.  Medications given as prescribed.  Support and encouragement offered as needed.  Attended group and participated.  States goal for today is "take care of my daughter."  Patient observed socializing with peers in the dayroom.  Tylenol 650 mg given for pain with good effect.

## 2016-05-31 NOTE — Progress Notes (Signed)
D: Pt denies SI/HI/AVH. Pt is pleasant and cooperative. Pt goal for today is to get some rest. A: Pt was offered support and encouragement. Pt was given scheduled medications. Pt was encourage to attend groups. Q 15 minute checks were done for safety.  R:Pt attends groups and interacts well with peers and staff. Pt is taking medication. Pt has no complaints.Pt receptive to treatment and safety maintained on unit.

## 2016-05-31 NOTE — Progress Notes (Signed)
Recreation Therapy Notes  Animal-Assisted Activity (AAA) Program Checklist/Progress Notes Patient Eligibility Criteria Checklist & Daily Group note for Rec TxIntervention  Date: 02.06.2018 Time: 2:45pm Location: [address] Dayroom    AAA/T Program Assumption of Risk Form signed by Patient/ or Parent Legal Guardian Yes  Patient is free of allergies or sever asthma Yes  Patient reports no fear of animals Yes  Patient reports no history of cruelty to animals Yes  Patient understands his/her participation is voluntary Yes  Patient washes hands before animal contact Yes  Patient washes hands after animal contact Yes  Behavioral Response: Did not attend.   Richard Larson L Amberle Lyter, LRT/CTRS        Almee Pelphrey L 05/31/2016 3:07 PM 

## 2016-05-31 NOTE — Progress Notes (Signed)
At the beginning of the shift, pt was in bed with eyes closed apparently asleep.  Writer called his name several times, and he finally responded.  He reports that he is beginning to have some withdrawal symptoms, and showed writer his hands which were shaking.  He stated he was "shaking inside", and that he was having a little nausea.  He received medications per the detox protocol for his symptoms.  He denies SI/HI/AVH.  He wants to better himself and support his daughter as well as his girlfriend's son.  He has been pleasant and cooperative.  He makes his needs known to staff.  Pt was encouraged to let staff know if his withdrawal symptoms increased or changed.  Support and encouragement offered.  Discharge plans are in process.  AT this point, pt plans to return home.  Safety maintained with q15 minute checks.

## 2016-05-31 NOTE — H&P (Signed)
Psychiatric Admission Assessment Adult  Patient Identification: Richard Larson  MRN:  [id number]  Date of Evaluation:  05/31/2016  Chief Complaint: Suicidal threats, alcohol intoxications leading to unresponsivness.  Principal Diagnosis: Alcohol use disorder, Bipolar affective disorder.  Diagnosis:   Patient Active Problem List   Diagnosis Date Noted  . Alcohol abuse with alcohol-induced mood disorder ([place]) [F10.14] 05/30/2016  . Acute encephalopathy [G93.40] 05/29/2016  . Acute respiratory failure ([place]) [J96.00] 05/29/2016  . Hypokalemia [E87.6] 05/29/2016  . Alcoholic intoxication with complication ([place]) [G83.662]   . Alcohol intoxication ([place]) [F10.929] 05/28/2016  . Bipolar 1 disorder ([place]) [F31.9] 05/28/2016  . Polysubstance abuse [F19.10] 05/28/2016  . Suicidal behavior with attempted self-injury [T14.91XA] 05/28/2016  . Involuntary commitment [Z04.6] 05/28/2016  . Aspiration pneumonia ([place]) [J69.0] 05/28/2016   History of Present Illness: This is the first inpatient psychiatric admission for this 21 year old Caucasian male with hx of Major depressive disorder & anxiety disorder. He reports has been receiving mental health care from the [facility] in [place], [place]. He is being admitted to the [facility] adult unit with complaints of Alcohol intoxication. His BAL on admission was 585 & UDS negative for all other substances. Chart review indicated hx of Bipolar disorder & non-compliance to his medication regimen. Apparently, patient had an argument with father, made a statement about killing himself or someone else, then will place the head on his father's porch. On attempting to IVC patient to come to the hospital, he was found unresponsive near a pool of vomit & signs that he may have been drinking heavily.   During this assessment, Shrey reports, "I drank a lot of liquor that I had bought the day prior. I passed out on the coach. My father found me & told me that I must go to the  hospital. I have not been drinking a lot. This time, it was a spur of the moment. I want to straighten my life. I do not want to be like my mother. She is an alcoholic & has drug problems. She was caught with drugs & now in prison for drug possession. I was diagnosed with depression & anxiety in 2013. I was put on Xanax &  Lexapro. I have never attempted suicide & no hx in my family. I don't hear voices or see things. I sleep well for the most part. The only problem I have is back pains. Right now, I'm not depressed or having withdrawal symptoms. I have been going to the [facility] in [place], [place] to meet with my psychiatrist on regular basis. I want to do better for my 2 children".  Associated Signs/Symptoms:  Depression Symptoms:  depressed mood, insomnia,  (Hypo) Manic Symptoms:  Impulsivity,  Anxiety Symptoms:  Excessive Worry,  Psychotic Symptoms:  Patient currently denies any hallucinations, delusional thoughts or paranoia.  PTSD Symptoms: Denies any PTSD symptoms or events.  Total Time spent with patient: 1 hour  Past Psychiatric History: Alcohol use disorder, Major depression/anxiety.  Is the patient at risk to self? No.  Has the patient been a risk to self in the past 6 months? No.  Has the patient been a risk to self within the distant past? No.  Is the patient a risk to others? No.  Has the patient been a risk to others in the past 6 months? No.  Has the patient been a risk to others within the distant past? No.   Prior Inpatient Therapy:   Prior Outpatient Therapy:    Alcohol  Screening: 1. How often do you have a drink containing alcohol?: Monthly or less 2. How many drinks containing alcohol do you have on a typical day when you are drinking?: 10 or more 3. How often do you have six or more drinks on one occasion?: Less than monthly Preliminary Score: 5 4. How often during the last year have you found that you were not able to stop drinking once you had started?:  Never 5. How often during the last year have you failed to do what was normally expected from you becasue of drinking?: Never 6. How often during the last year have you needed a first drink in the morning to get yourself going after a heavy drinking session?: Never 7. How often during the last year have you had a feeling of guilt of remorse after drinking?: Never 8. How often during the last year have you been unable to remember what happened the night before because you had been drinking?: Less than monthly 9. Have you or someone else been injured as a result of your drinking?: No 10. Has a relative or friend or a doctor or another health worker been concerned about your drinking or suggested you cut down?: No Alcohol Use Disorder Identification Test Final Score (AUDIT): 7 Brief Intervention: Patient declined brief intervention  Substance Abuse History in the last 12 months:  Yes.    Consequences of Substance Abuse: Medical Consequences:  Liver damage, Possible death by overdose Legal Consequences:  Arrests, jail time, Loss of driving privilege. Family Consequences:  Family discord, divorce and or separation.  Previous Psychotropic Medications: Yes (Xanax, Lexapro).  Psychological Evaluations: Yes   Past Medical History:  Past Medical History:  Diagnosis Date  . Bipolar 1 disorder ([place])   . Drug abuse   . Suicide attempt    History reviewed. No pertinent surgical history.  Family History: History reviewed. No pertinent family history.  Family Psychiatric  History: Bipolar disorder: Mother. Substance dependency: Mother.  Tobacco Screening: Have you used any form of tobacco in the last 30 days? (Cigarettes, Smokeless Tobacco, Cigars, and/or Pipes): Yes Tobacco use, Select all that apply: 5 or more cigarettes per day Are you interested in Tobacco Cessation Medications?: Yes, will notify MD for an order Counseled patient on smoking cessation including recognizing danger situations,  developing coping skills and basic information about quitting provided: Refused/Declined practical counseling  Social History:  History  Alcohol Use  . Yes    Comment: 770 ml; BAL was 595     History  Drug Use No    Additional Social History: Pain Medications: Denies Prescriptions: See PTA Over the Counter: See PTA History of alcohol / drug use?: Yes Name of Substance 1: Alcohol 1 - Age of First Use: unknown 1 - Amount (size/oz): 750 ml 1 - Frequency: "I just did it two days ago, time before that was 2 months ago" 1 - Duration: ongoing 1 - Last Use / Amount: 05/28/16  Allergies:  No Known Allergies  Lab Results:  Results for orders placed or performed during the hospital encounter of 05/28/16 (from the past 48 hour(s))  Basic metabolic panel     Status: Abnormal   Collection Time: 05/30/16  4:22 AM  Result Value Ref Range   Sodium 137 135 - 145 mmol/L    Comment: DELTA CHECK NOTED   Potassium 3.5 3.5 - 5.1 mmol/L   Chloride 105 101 - 111 mmol/L   CO2 25 22 - 32 mmol/L   Glucose, Bld  103 (H) 65 - 99 mg/dL   BUN 9 6 - 20 mg/dL   Creatinine, Ser 0.95 0.61 - 1.24 mg/dL   Calcium 8.7 (L) 8.9 - 10.3 mg/dL   GFR calc non Af Amer >60 >60 mL/min   GFR calc Af Amer >60 >60 mL/min    Comment: (NOTE) The eGFR has been calculated using the CKD EPI equation. This calculation has not been validated in all clinical situations. eGFR's persistently <60 mL/min signify possible Chronic Kidney Disease.    Anion gap 7 5 - 15  CBC     Status: Abnormal   Collection Time: 05/30/16  4:22 AM  Result Value Ref Range   WBC 7.1 4.0 - 10.5 K/uL   RBC 4.19 (L) 4.22 - 5.81 MIL/uL   Hemoglobin 13.2 13.0 - 17.0 g/dL   HCT 38.3 (L) 39.0 - 52.0 %   MCV 91.4 78.0 - 100.0 fL   MCH 31.5 26.0 - 34.0 pg   MCHC 34.5 30.0 - 36.0 g/dL   RDW 12.9 11.5 - 15.5 %   Platelets 181 150 - 400 K/uL  Magnesium     Status: None   Collection Time: 05/30/16  4:22 AM  Result Value Ref Range   Magnesium 1.7 1.7 -  2.4 mg/dL   Blood Alcohol level:  Lab Results  Component Value Date   ETH 585 (HH) 78/24/2353   Metabolic Disorder Labs:  No results found for: HGBA1C, MPG No results found for: PROLACTIN No results found for: CHOL, TRIG, HDL, CHOLHDL, VLDL, LDLCALC  Current Medications: Current Facility-Administered Medications  Medication Dose Route Frequency Provider Last Rate Last Dose  . acetaminophen (TYLENOL) tablet 650 mg  650 mg Oral Q6H PRN Ethelene Hal, NP      . ALPRAZolam Duanne Moron) tablet 0.5 mg  0.5 mg Oral QID Ethelene Hal, NP   0.5 mg at 05/31/16 1108  . alum & mag hydroxide-simeth (MAALOX/MYLANTA) 200-200-20 MG/5ML suspension 30 mL  30 mL Oral Q4H PRN Ethelene Hal, NP      . amoxicillin-clavulanate (AUGMENTIN) 875-125 MG per tablet 1 tablet  1 tablet Oral Q12H Ethelene Hal, NP   1 tablet at 05/31/16 0809  . escitalopram (LEXAPRO) tablet 20 mg  20 mg Oral Daily Ethelene Hal, NP   20 mg at 05/31/16 0809  . hydrOXYzine (ATARAX/VISTARIL) tablet 25 mg  25 mg Oral Q6H PRN Ethelene Hal, NP      . magnesium hydroxide (MILK OF MAGNESIA) suspension 30 mL  30 mL Oral Daily PRN Ethelene Hal, NP      . nicotine (NICODERM CQ - dosed in mg/24 hours) patch 21 mg  21 mg Transdermal Daily Jenne Campus, MD   21 mg at 05/31/16 0810  . traZODone (DESYREL) tablet 50 mg  50 mg Oral QHS PRN Ethelene Hal, NP   50 mg at 05/30/16 2126   PTA Medications: Prescriptions Prior to Admission  Medication Sig Dispense Refill Last Dose  . ALPRAZolam (XANAX) 0.5 MG tablet Take 0.5 mg by mouth 4 (four) times daily.     Marland Kitchen amoxicillin-clavulanate (AUGMENTIN) 875-125 MG tablet Take 1 tablet by mouth every 12 (twelve) hours. 10 tablet 0   . diclofenac sodium (VOLTAREN) 1 % GEL Place 1 application onto the skin 4 (four) times daily as needed.     Marland Kitchen escitalopram (LEXAPRO) 20 MG tablet Take 20 mg by mouth daily.     . metoprolol tartrate (LOPRESSOR) 25 MG tablet  Take  0.5 tablets (12.5 mg total) by mouth 2 (two) times daily. 60 tablet 0    Musculoskeletal: Strength & Muscle Tone: within normal limits Gait & Station: normal Patient leans: N/A  Psychiatric Specialty Exam: Physical Exam  Constitutional: He appears well-developed.  HENT:  Head: Normocephalic.  Eyes: Pupils are equal, round, and reactive to light.  Neck: Normal range of motion.  Cardiovascular:  Elevated BP & pulse pressure.  Respiratory: Effort normal.  GI: Soft.  Genitourinary:  Genitourinary Comments: Deferred  Musculoskeletal: Normal range of motion.  Neurological: He is alert.  Skin: Skin is warm.    Review of Systems  Constitutional: Positive for diaphoresis and malaise/fatigue.  Eyes: Positive for blurred vision.  Respiratory: Negative.   Cardiovascular: Negative.   Gastrointestinal: Positive for nausea.  Genitourinary: Negative.   Musculoskeletal: Positive for myalgias.  Skin: Negative.   Neurological: Positive for dizziness and weakness.  Endo/Heme/Allergies: Negative.   Psychiatric/Behavioral: Positive for depression and substance abuse (Alcoholism). Negative for hallucinations, memory loss and suicidal ideas. The patient is nervous/anxious and has insomnia.     Blood pressure (!) 131/97, pulse (!) 101, temperature 97.9 F (36.6 C), temperature source Oral, resp. rate 16, height 5' 10" (1.778 m), weight 82.1 kg (181 lb), SpO2 100 %.Body mass index is 25.97 kg/m.  General Appearance: Casual and Guarded  Eye Contact:  Fair  Speech:  Clear and Coherent and Normal Rate  Volume:  Normal  Mood:  Anxious  Affect:  Appropriate  Thought Process:  Coherent and Descriptions of Associations: Intact  Orientation:  Full (Time, Place, and Person)  Thought Content:  Denies any hallucinations, delusional thoughts or paranoia.  Suicidal Thoughts:  Denies any thoughts, plans or intent.  Homicidal Thoughts:  Denies any thoughts, plans or intent.  Memory:  Immediate;    Good Recent;   Good Remote;   Good  Judgement:  Fair  Insight:  Lacking  Psychomotor Activity:  Normal  Concentration:  Concentration: Good and Attention Span: Good  Recall:  [employer] of Knowledge:  Fair  Language:  Good  Akathisia:  Negative  Handed:  Right  AIMS (if indicated):     Assets:  Communication Skills Desire for Improvement Social Support  ADL's:  Intact  Cognition:  WNL  Sleep:  Number of Hours: 6.25   Treatment Plan/Recommendations: 1. Admit for crisis management and stabilization, estimated length of stay 3-5 days.  2. Medication management to reduce current symptoms to base line and improve the patient's overall level of functioning: See MAR.  3. Treat health problems as indicated.  4. Develop treatment plan to decrease risk of relapse upon discharge and the need for readmission.  5. Psycho-social education regarding relapse prevention and self care.  6. Health care follow up as needed for medical problems.  7. Review, reconcile, and reinstate any pertinent home medications for other health issues where appropriate. 8. Call for consults with hospitalist for any additional specialty patient care services as needed.  Observation Level/Precautions:  15 minute checks  Laboratory:  PER ED, BAL 585  Psychotherapy: Group sessions  Medications: See MAR  Consultations: As needed   Discharge Concerns: Sobriety   Estimated LOS: 2-4 days  Other:  Admit to [address]   Physician Treatment Plan for Primary Diagnosis: Alcohol intoxication ([place])  Long Term Goal(s): Improvement in symptoms so as ready for discharge  Short Term Goals: Ability to identify changes in lifestyle to reduce recurrence of condition will improve, Ability to verbalize feelings will improve, Ability to disclose  and discuss suicidal ideas and Ability to demonstrate self-control will improve  Physician Treatment Plan for Secondary Diagnosis: Principal Problem:   Alcohol intoxication ([place]) Active  Problems:   Alcohol abuse with alcohol-induced mood disorder ([place])  Long Term Goal(s): Improvement in symptoms so as ready for discharge  Short Term Goals: Ability to identify changes in lifestyle to reduce recurrence of condition will improve, Ability to identify and develop effective coping behaviors will improve, Compliance with prescribed medications will improve and Ability to identify triggers associated with substance abuse/mental health issues will improve  I certify that inpatient services furnished can reasonably be expected to improve the patient's condition.    Encarnacion Slates, NP, PMHNP, FNP-BC 2/6/20182:04 PM  I have discussed case with treatment team and have met with patient  Agree with NP note and assessment  21 year old single male , admitted under commitment generated by his family. The report is that patient had made suicidal statements and had also threatened to kill someone else.  Patient currently minimizes above, states he is not suicidal and denies any homicidality or violent ideations. Does state he had " an argument " with his father, but feels his father " over reacted". Of note, patient was found unresponsive when authorities served commitment, he was found to be severely intoxicated with alcohol- BAL 585 on admission, required emergent medical care/iintubation.  At this time patient tends to minimize above, states he did drink that day, but does not remember how much and states he drinks only on occasion, denies alcohol abuse or dependence pattern. He is not currently presenting with any alcohol or BZD withdrawal symptoms- no tremors, no diaphoresis, no acute distress , no facial flushing . BP is elevated, pulse 101. Patient denies prior suicide attempts . He states he has been on Lexapro and  Xanax for anxiety . States he has been on Xanax for " more than a year", for anxiety. Of note, admission UDS negative .  Dx- Acute Alcohol Poisoning , Suicidal Ideations Plan-   Inpatient admission.  We discussed rationale to minimize BZD use, and risks associated with alcohol binges and BZDs- patient currently agreeing to detox. D/C Xanax. Start Ativan detox protocol.  Continue Lexapro 20 mgrs QDAY

## 2016-05-31 NOTE — B[facility] Suicide Risk Assessment (Signed)
[facility] Admission Suicide Risk Assessment   Nursing information obtained from:  Patient Demographic factors:  Adolescent or young adult, Caucasian, Unemployed, Access to firearms Current Mental Status:  NA Loss Factors:  Decrease in vocational status, Financial problems / change in socioeconomic status Historical Factors:  Impulsivity Risk Reduction Factors:  Responsible for children under 21 years of age, Living with another person, especially a relative  Total Time spent with patient: 45 minutes Principal Problem: Alcohol intoxication (HCC) Diagnosis:   Patient Active Problem List   Diagnosis Date Noted  . Alcohol abuse with alcohol-induced mood disorder (HCC) [F10.14] 05/30/2016  . Acute encephalopathy [G93.40] 05/29/2016  . Acute respiratory failure ([facility]) [J96.00] 05/29/2016  . Hypokalemia [E87.6] 05/29/2016  . Alcoholic intoxication with complication (HCC) [F10.929]   . Alcohol intoxication (HCC) [F10.929] 05/28/2016  . Bipolar 1 disorder ([facility]) [F31.9] 05/28/2016  . Polysubstance abuse [F19.10] 05/28/2016  . Suicidal behavior with attempted self-injury [T14.91XA] 05/28/2016  . Involuntary commitment [Z04.6] 05/28/2016  . Aspiration pneumonia ([facility]) [J69.0] 05/28/2016    Continued Clinical Symptoms:  Alcohol Use Disorder Identification Test Final Score (AUDIT): 7 The "Alcohol Use Disorders Identification Test", Guidelines for Use in Primary Care, Second Edition.  World Science writer [facility]). Score between 0-7:  no or low risk or alcohol related problems. Score between 8-15:  moderate risk of alcohol related problems. Score between 16-19:  high risk of alcohol related problems. Score 20 or above:  warrants further diagnostic evaluation for alcohol dependence and treatment.   CLINICAL FACTORS:  21 year old single male , admitted under commitment generated by his family. The report is that patient had made suicidal statements and had also threatened to kill someone else.   Patient currently minimizes above, states he is not suicidal and denies any homicidality or violent ideations. Does state he had " an argument " with his father, but feels his father " over reacted". Of note, patient was found unresponsive when authorities served commitment, he was found to be severely intoxicated with alcohol- BAL 585 on admission, required emergent medical care/iintubation.  At this time patient tends to minimize above, states he did drink that day, but does not remember how much and states he drinks only on occasion, denies alcohol abuse or dependence pattern. He is not currently presenting with any alcohol or BZD withdrawal symptoms- no tremors, no diaphoresis, no acute distress , no facial flushing . BP is elevated, pulse 101. Patient denies prior suicide attempts . He states he has been on Lexapro and  Xanax for anxiety . States he has been on Xanax for " more than a year", for anxiety. Of note, admission UDS negative .  Dx- Acute Alcohol Poisoning , Suicidal Ideations Plan-  Inpatient admission.  We discussed rationale to minimize BZD use, and risks associated with alcohol binges and BZDs- patient currently agreeing to detox. D/C Xanax. Start Ativan detox protocol.  Continue Lexapro 20 mgrs QDAY   Musculoskeletal: Strength & Muscle Tone: within normal limits Gait & Station: normal Patient leans: N/A  Psychiatric Specialty Exam: Physical Exam  ROS denies headache, no chest pain, no shortness of breath, no nausea, no vomiting , no fever, no chills   Blood pressure (!) 131/97, pulse (!) 101, temperature 97.9 F (36.6 C), temperature source Oral, resp. rate 16, height 5\' 10"  (1.778 m), weight 82.1 kg (181 lb), SpO2 100 %.Body mass index is 25.97 kg/m.  General Appearance: Fairly Groomed  Eye Contact:  Good  Speech:  Normal Rate  Volume:  Normal  Mood:  denies depression, states mood "OK"  Affect:  appropriate, mildly anxious   Thought Process:  Linear  Orientation:   Full (Time, Place, and Person)  Thought Content:  denies hallucinations,no delusions, not internally preoccupied   Suicidal Thoughts:  No denies any suicidal or self injurious ideations, denies homicidal or violent ideations   Homicidal Thoughts:  No  Memory:  recent and remote grossly intact   Judgement:  Impaired  Insight:  Lacking  Psychomotor Activity:  Normal- no current tremors or psychomotor restlessness   Concentration:  Concentration: Good and Attention Span: Good  Recall:  Good  Fund of Knowledge:  Good  Language:  Good  Akathisia:  Negative  Handed:  Right  AIMS (if indicated):     Assets:  Desire for Improvement Resilience  ADL's:  Intact  Cognition:  WNL  Sleep:  Number of Hours: 6.25      COGNITIVE FEATURES THAT CONTRIBUTE TO RISK:  Closed-mindedness and Loss of executive function    SUICIDE RISK:   Moderate:  Frequent suicidal ideation with limited intensity, and duration, some specificity in terms of plans, no associated intent, good self-control, limited dysphoria/symptomatology, some risk factors present, and identifiable protective factors, including available and accessible social support.  PLAN OF CARE: Patient will be admitted to inpatient psychiatric unit for stabilization and safety. Will provide and encourage milieu participation. Provide medication management and maked adjustments as needed.  Will follow daily.    I certify that inpatient services furnished can reasonably be expected to improve the patient's condition.   Richard Larson, Richard Tomkins, MD 05/31/2016, 2:13 PM

## 2016-05-31 NOTE — Tx Team (Signed)
Interdisciplinary Treatment and Diagnostic Plan Update  05/31/2016 Time of Session: 9:30AM Richard Larson MRN: [id number]  Principal Diagnosis: Alcohol intoxication (HCC)  Secondary Diagnoses: Principal Problem:   Alcohol intoxication (HCC) Active Problems:   Alcohol abuse with alcohol-induced mood disorder (HCC)   Current Medications:  Current Facility-Administered Medications  Medication Dose Route Frequency Provider Last Rate Last Dose  . acetaminophen (TYLENOL) tablet 650 mg  650 mg Oral Q6H PRN Laveda Abbe, NP      . ALPRAZolam [name]) tablet 0.5 mg  0.5 mg Oral QID Laveda Abbe, NP   0.5 mg at 05/31/16 0809  . alum & mag hydroxide-simeth (MAALOX/MYLANTA) 200-200-20 MG/5ML suspension 30 mL  30 mL Oral Q4H PRN Laveda Abbe, NP      . amoxicillin-clavulanate (AUGMENTIN) 875-125 MG per tablet 1 tablet  1 tablet Oral Q12H Laveda Abbe, NP   1 tablet at 05/31/16 0809  . escitalopram (LEXAPRO) tablet 20 mg  20 mg Oral Daily Laveda Abbe, NP   20 mg at 05/31/16 0809  . hydrOXYzine (ATARAX/VISTARIL) tablet 25 mg  25 mg Oral Q6H PRN Laveda Abbe, NP      . magnesium hydroxide (MILK OF MAGNESIA) suspension 30 mL  30 mL Oral Daily PRN Laveda Abbe, NP      . nicotine (NICODERM CQ - dosed in mg/24 hours) patch 21 mg  21 mg Transdermal Daily Craige Cotta, MD   21 mg at 05/31/16 0810  . pneumococcal 23 valent vaccine (PNU-IMMUNE) injection 0.5 mL  0.5 mL Intramuscular Tomorrow-1000 Rockey Situ Cobos, MD      . traZODone (DESYREL) tablet 50 mg  50 mg Oral QHS PRN Laveda Abbe, NP   50 mg at 05/30/16 2126   PTA Medications: Prescriptions Prior to Admission  Medication Sig Dispense Refill Last Dose  . ALPRAZolam (XANAX) 0.5 MG tablet Take 0.5 mg by mouth 4 (four) times daily.     Marland Kitchen amoxicillin-clavulanate (AUGMENTIN) 875-125 MG tablet Take 1 tablet by mouth every 12 (twelve) hours. 10 tablet 0   . diclofenac sodium (VOLTAREN) 1 %  GEL Place 1 application onto the skin 4 (four) times daily as needed.     Marland Kitchen escitalopram (LEXAPRO) 20 MG tablet Take 20 mg by mouth daily.     . metoprolol tartrate (LOPRESSOR) 25 MG tablet Take 0.5 tablets (12.5 mg total) by mouth 2 (two) times daily. 60 tablet 0     Patient Stressors: Financial difficulties Marital or family conflict Occupational concerns Other: Recent move to the area (from [place])  Patient Strengths: Capable of independent living Wellsite geologist fund of knowledge Physical Health Supportive family/friends  Treatment Modalities: Medication Management, Group therapy, Case management,  1 to 1 session with clinician, Psychoeducation, Recreational therapy.   Physician Treatment Plan for Primary Diagnosis: Alcohol intoxication (HCC) Long Term Goal(s):     Short Term Goals:    Medication Management: Evaluate patient's response, side effects, and tolerance of medication regimen.  Therapeutic Interventions: 1 to 1 sessions, Unit Group sessions and Medication administration.  Evaluation of Outcomes: Progressing  Physician Treatment Plan for Secondary Diagnosis: Principal Problem:   Alcohol intoxication (HCC) Active Problems:   Alcohol abuse with alcohol-induced mood disorder (HCC)  Long Term Goal(s):     Short Term Goals:       Medication Management: Evaluate patient's response, side effects, and tolerance of medication regimen.  Therapeutic Interventions: 1 to 1 sessions, Unit Group sessions and Medication administration.  Evaluation of  Outcomes: Progressing   RN Treatment Plan for Primary Diagnosis: Alcohol intoxication (HCC) Long Term Goal(s): Knowledge of disease and therapeutic regimen to maintain health will improve  Short Term Goals: Ability to remain free from injury will improve, Ability to verbalize feelings will improve and Ability to disclose and discuss suicidal ideas  Medication Management: RN will administer medications as  ordered by provider, will assess and evaluate patient's response and provide education to patient for prescribed medication. RN will report any adverse and/or side effects to prescribing provider.  Therapeutic Interventions: 1 on 1 counseling sessions, Psychoeducation, Medication administration, Evaluate responses to treatment, Monitor vital signs and CBGs as ordered, Perform/monitor CIWA, COWS, AIMS and Fall Risk screenings as ordered, Perform wound care treatments as ordered.  Evaluation of Outcomes: Progressing   LCSW Treatment Plan for Primary Diagnosis: Alcohol intoxication (HCC) Long Term Goal(s): Safe transition to appropriate next level of care at discharge, Engage patient in therapeutic group addressing interpersonal concerns.  Short Term Goals: Engage patient in aftercare planning with referrals and resources, Facilitate patient progression through stages of change regarding substance use diagnoses and concerns and Identify triggers associated with mental health/substance abuse issues  Therapeutic Interventions: Assess for all discharge needs, 1 to 1 time with Social worker, Explore available resources and support systems, Assess for adequacy in community support network, Educate family and significant other(s) on suicide prevention, Complete Psychosocial Assessment, Interpersonal group therapy.  Evaluation of Outcomes: Progressing   Progress in Treatment: Attending groups: No. New to unit. Continuing to assess.  Participating in groups: No. Taking medication as prescribed: Yes. Toleration medication: Yes. Family/Significant other contact made: No, will contact:  family member if patient consents. Patient understands diagnosis: Yes. Discussing patient identified problems/goals with staff: Yes. Medical problems stabilized or resolved: Yes. Denies suicidal/homicidal ideation: Yes. Issues/concerns per patient self-inventory: No. Other: n/a   New problem(s) identified: Yes,  Describe:  pt demonstrates minimal insight at this time. requesting to return home.  New Short Term/Long Term Goal(s):  Discharge Plan or Barriers: CSW assessing for appropriate referrals. First admission to this hospital. Pt reports no current psychiatric providers with hx of ambien/xanax prescription and bipolar dx. Pt does not remember prescriber or psychiatrist.   Reason for Continuation of Hospitalization: Aggression Depression Medication stabilization Withdrawal symptoms  Estimated Length of Stay: 3-5 days   Attendees: Patient: 05/31/2016 8:26 AM  Physician: Javier Dockerr.Cobos MD 05/31/2016 8:26 AM  Nursing: Ricard DillonElizabeth RnPolo Riley; Jane Rn 05/31/2016 8:26 AM  RN Care Manager: Onnie BoerJennifer Clark CM 05/31/2016 8:26 AM  Social Worker: Trula SladeHeather Smart, LCSW 05/31/2016 8:26 AM  Recreational Therapist:  05/31/2016 8:26 AM  Other: Armandina StammerAgnes Nwoko NP; Gray BernhardtMay Augustin NP 05/31/2016 8:26 AM  Other:  05/31/2016 8:26 AM  Other: 05/31/2016 8:26 AM    Scribe for Treatment Team: Ledell PeoplesHeather N Smart, LCSW 05/31/2016 8:26 AM

## 2016-05-31 NOTE — [facility] Group Notes (Signed)
[facility] LCSW Group Therapy  05/31/2016 4:39 PM  Type of Therapy:  Group Therapy  Participation Level:  Active  Participation Quality:  Attentive  Affect:  Appropriate  Cognitive:  Alert and Oriented  Insight:  Improving  Engagement in Therapy:  Improving  Modes of Intervention:  Confrontation, Discussion, Education, Socialization and Support  Summary of Progress/Problems: [facility] Speaker came to talk about his personal journey with substance abuse and addiction. The pt processed ways by which to relate to the speaker. MHA speaker provided handouts and educational information pertaining to groups and services offered by [facility].   Ledell PeoplesHeather N Smart LCSW 05/31/2016, 4:39 PM

## 2016-06-01 MED ORDER — THIAMINE HCL 100 MG/ML IJ SOLN: 100.0000 mg | Freq: Once | INTRAMUSCULAR | Status: DC

## 2016-06-01 MED ORDER — ONDANSETRON 4 MG PO TBDP: 4.0000 mg | ORAL_TABLET | Freq: Four times a day (QID) | ORAL | Status: DC | PRN

## 2016-06-01 MED ORDER — LORAZEPAM 1 MG PO TABS: 1.0000 mg | ORAL_TABLET | Freq: Two times a day (BID) | ORAL | Status: DC

## 2016-06-01 MED ORDER — LOPERAMIDE HCL 2 MG PO CAPS: 2.0000 mg | ORAL_CAPSULE | ORAL | Status: DC | PRN

## 2016-06-01 MED ORDER — LORAZEPAM 1 MG PO TABS: 1.0000 mg | ORAL_TABLET | Freq: Three times a day (TID) | ORAL | Status: DC

## 2016-06-01 MED ORDER — VITAMIN B-1 100 MG PO TABS
100.0000 mg | ORAL_TABLET | Freq: Every day | ORAL | Status: DC
  Administered 2016-06-02: 100 mg via ORAL
  Filled 2016-06-01 (×3): qty 1

## 2016-06-01 MED ORDER — LORAZEPAM 1 MG PO TABS: 1.0000 mg | ORAL_TABLET | Freq: Every day | ORAL | Status: DC

## 2016-06-01 MED ORDER — ADULT MULTIVITAMIN W/MINERALS CH
1.0000 | ORAL_TABLET | Freq: Every day | ORAL | Status: DC
  Administered 2016-06-02: 1 via ORAL
  Filled 2016-06-01 (×3): qty 1

## 2016-06-01 MED ORDER — LORAZEPAM 1 MG PO TABS
1.0000 mg | ORAL_TABLET | Freq: Four times a day (QID) | ORAL | Status: AC
  Administered 2016-06-01 (×3): 1 mg via ORAL
  Filled 2016-06-01 (×3): qty 1

## 2016-06-01 MED ORDER — HYDROXYZINE HCL 25 MG PO TABS: 25.0000 mg | ORAL_TABLET | Freq: Four times a day (QID) | ORAL | Status: DC | PRN

## 2016-06-01 NOTE — Plan of Care (Signed)
Problem: Safety: Goal: Periods of time without injury will increase Outcome: Progressing Patient has not engaged in self harm, denies SI.  Problem: Education: Goal: Knowledge of the prescribed therapeutic regimen will improve Outcome: Progressing Patient verbalizes understanding of education regarding POC.

## 2016-06-01 NOTE — [facility] Suicide Risk Assessment (Signed)
[facility] INPATIENT:  Family/Significant Other Suicide Prevention Education  Suicide Prevention Education:  Contact Attempts:   Glenis SmokerMichael Vitug (pt's father) [phone] has been identified by the patient as the family member/significant other with whom the patient will be residing, and identified as the person(s) who will aid the patient in the event of a mental health crisis.  With written consent from the patient, two attempts were made to provide suicide prevention education, prior to and/or following the patient's discharge.  We were unsuccessful in providing suicide prevention education.  A suicide education pamphlet was given to the patient to share with family/significant other.  Date and time of first attempt: 06/01/16 at 2:00PM (voicemail not set up).   Ledell PeoplesHeather N Smart 06/01/2016, 1:52 PM   Pt's father reports that he has no concerns regarding pt discharging but wants him to agree to outpatient counseling. SPE reviewed with pt's father in addition to aftercare plan. [facility] and appt has been scheduled. Dr. Jama Flavorsobos and Gray BernhardtMay Augustin NP made aware of this conversation and aftercare plan.   Trula SladeHeather Smart, MSW, LCSW Clinical Social Worker 06/01/2016 2:36 PM

## 2016-06-01 NOTE — [facility] Group Notes (Signed)
[facility] LCSW Group Therapy  06/01/2016 3:55 PM  Type of Therapy:  Group Therapy  Participation Level:  Did Not Attend-pt invited. Chose to rest in room.   Summary of Progress/Problems: Emotion Regulation: This group focused on both positive and negative emotion identification and allowed group members to process ways to identify feelings, regulate negative emotions, and find healthy ways to manage internal/external emotions. Group members were asked to reflect on a time when their reaction to an emotion led to a negative outcome and explored how alternative responses using emotion regulation would have benefited them. Group members were also asked to discuss a time when emotion regulation was utilized when a negative emotion was experienced.   Keiston Manley N Smart LCSW 06/01/2016, 3:55 PM

## 2016-06-01 NOTE — Progress Notes (Signed)
D: Patient up and visible in the milieu. Spoke with patient 1:1. Rates sleep as good, appetite as good, energy as normal and concentration as good. Patient's affect anxious with congruent mood. Continues to ask for xanax. Rating depression at a 0/10, hopelessness at a 0/10 and anxiety at a 1/10. States goal for today is to "make it closer to getting out to see my kid and family. Go to meetings and pay attention." Complaining of withdrawal - CIWA is an "11" at noon. Patient reports tremors (observed), anxiety, sensitivity to light, some sweating and nausea.   A: Medicated per orders, prns given per symptoms. Med education provided. Emotional support offered and self inventory reviewed. Stressed to patient the seriousness of his BAL prior to admission. Encouraged completion of Suicide Safety Plan. Discussed POC with MD, SW.    R: Patient verbalizes understanding of POC. On reassess, patient reports some relief, particularly with ativan. Patient denies SI/HI and remains safe on level III obs.

## 2016-06-01 NOTE — Progress Notes (Signed)
[name] Progress Note  06/01/2016 12:55 PM Richard Larson  MRN:  [id number] Subjective:  Per nursing, patient appears nervous.  Patient observed to be flush colored to the face and stated that he felt "jittery." Objective:   Patient appeared more symptomatic of alcohol withdrawal.  His face reddened and c/o nasal stuffiness.  He also reports anxiety but is fixated on why he is not getting his Xanax.  "Why can't I get it here when I'll be taking it again once Im home."  Patient is being seen by outpatient provider who prescribed Xanax.    Principal Problem: Alcohol intoxication (HCC) Diagnosis:   Patient Active Problem List   Diagnosis Date Noted  . Alcohol abuse with alcohol-induced mood disorder (HCC) [F10.14] 05/30/2016  . Acute encephalopathy [G93.40] 05/29/2016  . Acute respiratory failure (HCC) [J96.00] 05/29/2016  . Hypokalemia [E87.6] 05/29/2016  . Alcoholic intoxication with complication (HCC) [F10.929]   . Alcohol intoxication (HCC) [F10.929] 05/28/2016  . Bipolar 1 disorder (HCC) [F31.9] 05/28/2016  . Polysubstance abuse [F19.10] 05/28/2016  . Suicidal behavior with attempted self-injury [T14.91XA] 05/28/2016  . Involuntary commitment [Z04.6] 05/28/2016  . Aspiration pneumonia (HCC) [J69.0] 05/28/2016   Total Time spent with patient: 30 minutes  Past Psychiatric History: see HPI  Past Medical History:  Past Medical History:  Diagnosis Date  . Bipolar 1 disorder (HCC)   . Drug abuse   . Suicide attempt    History reviewed. No pertinent surgical history. Family History: History reviewed. No pertinent family history. Family Psychiatric  History: see HPI Social History:  History  Alcohol Use  . Yes    Comment: 770 ml; BAL was 595     History  Drug Use No    Social History   Social History  . Marital status: Single    Spouse name: N/A  . Number of children: N/A  . Years of education: N/A   Occupational History  . unemployed    Social History Main Topics  .  Smoking status: Current Every Day Smoker    Packs/day: 0.50    Types: Cigarettes  . Smokeless tobacco: Never Used  . Alcohol use Yes     Comment: 770 ml; BAL was 595  . Drug use: No  . Sexual activity: Yes   Other Topics Concern  . None   Social History Narrative  . None   Additional Social History:    Pain Medications: Denies Prescriptions: See PTA Over the Counter: See PTA History of alcohol / drug use?: Yes Name of Substance 1: Alcohol 1 - Age of First Use: unknown 1 - Amount (size/oz): 750 ml 1 - Frequency: "I just did it two days ago, time before that was 2 months ago" 1 - Duration: ongoing 1 - Last Use / Amount: 05/28/16                  Sleep: Good  Appetite:  Fair  Current Medications: Current Facility-Administered Medications  Medication Dose Route Frequency Provider Last Rate Last Dose  . acetaminophen (TYLENOL) tablet 650 mg  650 mg Oral Q6H PRN Laveda Abbe, NP   650 mg at 06/01/16 1191  . alum & mag hydroxide-simeth (MAALOX/MYLANTA) 200-200-20 MG/5ML suspension 30 mL  30 mL Oral Q4H PRN Laveda Abbe, NP      . amoxicillin-clavulanate (AUGMENTIN) 875-125 MG per tablet 1 tablet  1 tablet Oral Q12H Laveda Abbe, NP   1 tablet at 06/01/16 0759  . escitalopram (LEXAPRO) tablet 20 mg  20 mg Oral Daily Laveda AbbeLaurie Britton Parks, NP   20 mg at 06/01/16 0759  . hydrOXYzine (ATARAX/VISTARIL) tablet 25 mg  25 mg Oral Q6H PRN Adonis BrookSheila Agustin, NP      . loperamide (IMODIUM) capsule 2-4 mg  2-4 mg Oral PRN Adonis BrookSheila Agustin, NP      . LORazepam (ATIVAN) tablet 1 mg  1 mg Oral Q6H PRN Craige CottaFernando A Keir Viernes, MD   1 mg at 06/01/16 0802  . LORazepam (ATIVAN) tablet 1 mg  1 mg Oral QID Adonis BrookSheila Agustin, NP   1 mg at 06/01/16 1205   Followed by  . [START ON 06/02/2016] LORazepam (ATIVAN) tablet 1 mg  1 mg Oral TID Adonis BrookSheila Agustin, NP       Followed by  . [START ON 06/03/2016] LORazepam (ATIVAN) tablet 1 mg  1 mg Oral BID Adonis BrookSheila Agustin, NP       Followed by  . [START  ON 06/04/2016] LORazepam (ATIVAN) tablet 1 mg  1 mg Oral Daily Adonis BrookSheila Agustin, NP      . magnesium hydroxide (MILK OF MAGNESIA) suspension 30 mL  30 mL Oral Daily PRN Laveda AbbeLaurie Britton Parks, NP      . multivitamin with minerals tablet 1 tablet  1 tablet Oral Daily Adonis BrookSheila Agustin, NP      . nicotine (NICODERM CQ - dosed in mg/24 hours) patch 21 mg  21 mg Transdermal Daily Rockey SituFernando A Jobeth Pangilinan, MD   21 mg at 06/01/16 0800  . ondansetron (ZOFRAN-ODT) disintegrating tablet 4 mg  4 mg Oral Q6H PRN Adonis BrookSheila Agustin, NP      . Melene Muller[START ON 06/02/2016] thiamine (VITAMIN B-1) tablet 100 mg  100 mg Oral Daily Adonis BrookSheila Agustin, NP      . traZODone (DESYREL) tablet 50 mg  50 mg Oral QHS,MR X 1 Kerry HoughSpencer E Simon, PA-C   50 mg at 05/31/16 2108    Lab Results: No results found for this or any previous visit (from the past 48 hour(s)).  Blood Alcohol level:  Lab Results  Component Value Date   ETH 585 (HH) 05/28/2016    Metabolic Disorder Labs: No results found for: HGBA1C, MPG No results found for: PROLACTIN No results found for: CHOL, TRIG, HDL, CHOLHDL, VLDL, LDLCALC  Physical Findings: AIMS: Facial and Oral Movements Muscles of Facial Expression: None, normal Lips and Perioral Area: None, normal Jaw: None, normal Tongue: None, normal,Extremity Movements Upper (arms, wrists, hands, fingers): None, normal Lower (legs, knees, ankles, toes): None, normal, Trunk Movements Neck, shoulders, hips: None, normal, Overall Severity Severity of abnormal movements (highest score from questions above): None, normal Incapacitation due to abnormal movements: None, normal Patient's awareness of abnormal movements (rate only patient's report): No Awareness, Dental Status Current problems with teeth and/or dentures?: No Does patient usually wear dentures?: No  CIWA:  CIWA-Ar Total: 11 COWS:     Musculoskeletal: Strength & Muscle Tone: within normal limits Gait & Station: normal Patient leans: N/A  Psychiatric Specialty  Exam: Physical Exam  Nursing note and vitals reviewed. Psychiatric: His speech is normal and behavior is normal. Judgment and thought content normal. His mood appears anxious. Cognition and memory are normal.    Review of Systems  Constitutional: Negative.   HENT: Negative.   Eyes: Negative.   Respiratory: Negative.   Cardiovascular: Negative.   Gastrointestinal: Negative.   Genitourinary: Negative.   Musculoskeletal: Negative.   Skin: Negative.   Neurological: Negative.   Endo/Heme/Allergies: Negative.   Psychiatric/Behavioral: Negative.     Blood pressure 131/79,  pulse 81, temperature 98.3 F (36.8 C), temperature source Oral, resp. rate 18, height 5\' 10"  (1.778 m), weight 82.1 kg (181 lb), SpO2 100 %.Body mass index is 25.97 kg/m.  General Appearance: Casual  Eye Contact:  Fair  Speech:  Clear and Coherent  Volume:  Normal  Mood:  Depressed  Affect:  Congruent  Thought Process:  Coherent  Orientation:  Full (Time, Place, and Person)  Thought Content:  Logical and Rumination  Suicidal Thoughts:  No  Homicidal Thoughts:  No  Memory:  Immediate;   Fair Recent;   Fair Remote;   Fair  Judgement:  Impaired  Insight:  Fair  Psychomotor Activity:  Normal  Concentration:  Concentration: Fair and Attention Span: Fair  Recall:  [employer] of Knowledge:  Fair  Language:  Fair  Akathisia:  No  Handed:  Right  AIMS (if indicated):     Assets:  Desire for Improvement Physical Health Resilience  ADL's:  Intact  Cognition:  WNL  Sleep:  Number of Hours: 6.75   Treatment Plan Summary: Review of chart, vital signs, medications, and notes.  1-Individual and group therapy  2-Medication management for depression and anxiety: Medications reviewed with the patient.  Lexapro 20 mg QD depression, Ativan for withdrawal protocol. 3-Coping skills for depression, anxiety  4-Continue crisis stabilization and management  5-Address health issues--monitoring vital signs, stable   6-Treatment plan in progress to prevent relapse of depression and anxiety  Lindwood Qua, NP [facility] 06/01/2016, 12:55 PM   Agree with NP Progress Note

## 2016-06-02 DIAGNOSIS — F17210 Nicotine dependence, cigarettes, uncomplicated: Secondary | ICD-10-CM

## 2016-06-02 DIAGNOSIS — J9600 Acute respiratory failure, unspecified whether with hypoxia or hypercapnia: Secondary | ICD-10-CM

## 2016-06-02 DIAGNOSIS — J690 Pneumonitis due to inhalation of food and vomit: Secondary | ICD-10-CM

## 2016-06-02 DIAGNOSIS — G9340 Encephalopathy, unspecified: Secondary | ICD-10-CM

## 2016-06-02 DIAGNOSIS — Z046 Encounter for general psychiatric examination, requested by authority: Secondary | ICD-10-CM

## 2016-06-02 DIAGNOSIS — F1910 Other psychoactive substance abuse, uncomplicated: Secondary | ICD-10-CM

## 2016-06-02 DIAGNOSIS — E876 Hypokalemia: Secondary | ICD-10-CM

## 2016-06-02 MED ORDER — HYDROXYZINE HCL 25 MG PO TABS: ORAL_TABLET | ORAL | 0 refills | Status: AC

## 2016-06-02 MED ORDER — ESCITALOPRAM OXALATE 20 MG PO TABS: 20.0000 mg | ORAL_TABLET | Freq: Every day | ORAL | 0 refills | Status: AC

## 2016-06-02 MED ORDER — TRAZODONE HCL 50 MG PO TABS: ORAL_TABLET | ORAL | 0 refills | Status: AC

## 2016-06-02 MED ORDER — NICOTINE 21 MG/24HR TD PT24: 21.0000 mg | MEDICATED_PATCH | Freq: Every day | TRANSDERMAL | 0 refills | Status: AC

## 2016-06-02 MED ORDER — AMOXICILLIN-POT CLAVULANATE 875-125 MG PO TABS: 1.0000 | ORAL_TABLET | Freq: Two times a day (BID) | ORAL | 0 refills | Status: AC

## 2016-06-02 NOTE — Progress Notes (Signed)
Pt reports he had a difficult time this morning with withdrawal symptoms, but is feeling a little better this evening and states he is probably going to discharge home tomorrow.  He says that he plans [facility] and says he had already been going to a group that meets at a church near him.  He denies SI/HI/AVH.  He states his withdrawal symptoms have gotten better since he was place on scheduled doses of Ativan.  He says that he is motivated to improve himself.  Pt was given two doses of Trazodone 50 mg tonight as he wants to get a good night sleep.  Support and encouragement offered.  Discharge plans are in process, and pt plans to return to his home where he lives with his father, pt's girlfriend, and her 52 yo son.  Safety maintained with q15 minute checks.

## 2016-06-02 NOTE — Progress Notes (Signed)
Pt d/c from the hospital with his family member. All items returned. D/C instructions given and prescriptions given. Pt denies si and hi.

## 2016-06-02 NOTE — Tx Team (Signed)
Interdisciplinary Treatment and Diagnostic Plan Update  06/02/2016 Time of Session: 9:30AM Graylin Sperling MRN: [id number]  Principal Diagnosis: Alcohol intoxication ([place]af[place])  Secondary Diagnoses: Principal Problem:   Alcohol intoxication ([place]) Active Problems:   Alcohol abuse with alcohol-induced mood disorder ([place])   Current Medications:  Current Facility-Administered Medications  Medication Dose Route Frequency Provider Last Rate Last Dose  . acetaminophen (TYLENOL) tablet 650 mg  650 mg Oral Q6H PRN Ethelene Hal, NP   650 mg at 06/01/16 1646  . alum & mag hydroxide-simeth (MAALOX/MYLANTA) 200-200-20 MG/5ML suspension 30 mL  30 mL Oral Q4H PRN Ethelene Hal, NP      . amoxicillin-clavulanate (AUGMENTIN) 875-125 MG per tablet 1 tablet  1 tablet Oral Q12H Ethelene Hal, NP   1 tablet at 06/02/16 [phone]  . escitalopram (LEXAPRO) tablet 20 mg  20 mg Oral Daily Ethelene Hal, NP   20 mg at 06/02/16 0254  . hydrOXYzine (ATARAX/VISTARIL) tablet 25 mg  25 mg Oral Q6H PRN Kerrie Buffalo, NP      . loperamide (IMODIUM) capsule 2-4 mg  2-4 mg Oral PRN Kerrie Buffalo, NP      . LORazepam (ATIVAN) tablet 1 mg  1 mg Oral Q6H PRN Jenne Campus, MD   1 mg at 06/01/16 0802  . LORazepam (ATIVAN) tablet 1 mg  1 mg Oral TID Kerrie Buffalo, NP       Followed by  . [START ON 06/03/2016] LORazepam (ATIVAN) tablet 1 mg  1 mg Oral BID Kerrie Buffalo, NP       Followed by  . [START ON 06/04/2016] LORazepam (ATIVAN) tablet 1 mg  1 mg Oral Daily Kerrie Buffalo, NP      . magnesium hydroxide (MILK OF MAGNESIA) suspension 30 mL  30 mL Oral Daily PRN Ethelene Hal, NP      . multivitamin with minerals tablet 1 tablet  1 tablet Oral Daily Kerrie Buffalo, NP   1 tablet at 06/02/16 0814  . nicotine (NICODERM CQ - dosed in mg/24 hours) patch 21 mg  21 mg Transdermal Daily Jenne Campus, MD   21 mg at 06/02/16 0816  . ondansetron (ZOFRAN-ODT) disintegrating tablet 4 mg  4 mg Oral Q6H  PRN Kerrie Buffalo, NP      . thiamine (VITAMIN B-1) tablet 100 mg  100 mg Oral Daily Kerrie Buffalo, NP   100 mg at 06/02/16 0814  . traZODone (DESYREL) tablet 50 mg  50 mg Oral QHS,MR X 1 Laverle Hobby, PA-C   50 mg at 06/01/16 2148   PTA Medications: Prescriptions Prior to Admission  Medication Sig Dispense Refill Last Dose  . ALPRAZolam (XANAX) 0.5 MG tablet Take 0.5 mg by mouth 4 (four) times daily.     Marland Kitchen amoxicillin-clavulanate (AUGMENTIN) 875-125 MG tablet Take 1 tablet by mouth every 12 (twelve) hours. 10 tablet 0   . diclofenac sodium (VOLTAREN) 1 % GEL Place 1 application onto the skin 4 (four) times daily as needed.     Marland Kitchen escitalopram (LEXAPRO) 20 MG tablet Take 20 mg by mouth daily.     . metoprolol tartrate (LOPRESSOR) 25 MG tablet Take 0.5 tablets (12.5 mg total) by mouth 2 (two) times daily. 60 tablet 0     Patient Stressors: Financial difficulties Marital or family conflict Occupational concerns Other: Recent move to the area (from [place])  Patient Strengths: Capable of independent living Curator fund of knowledge Physical Health Supportive family/friends  Treatment Modalities: Medication Management,  Group therapy, Case management,  1 to 1 session with clinician, Psychoeducation, Recreational therapy.   Physician Treatment Plan for Primary Diagnosis: Alcohol intoxication ([place]) Long Term Goal(s): Improvement in symptoms so as ready for discharge Improvement in symptoms so as ready for discharge   Short Term Goals: Ability to identify changes in lifestyle to reduce recurrence of condition will improve Ability to verbalize feelings will improve Ability to disclose and discuss suicidal ideas Ability to demonstrate self-control will improve Ability to identify changes in lifestyle to reduce recurrence of condition will improve Ability to identify and develop effective coping behaviors will improve Compliance with prescribed medications will  improve Ability to identify triggers associated with substance abuse/mental health issues will improve  Medication Management: Evaluate patient's response, side effects, and tolerance of medication regimen.  Therapeutic Interventions: 1 to 1 sessions, Unit Group sessions and Medication administration.  Evaluation of Outcomes: Met  Physician Treatment Plan for Secondary Diagnosis: Principal Problem:   Alcohol intoxication ([place]) Active Problems:   Alcohol abuse with alcohol-induced mood disorder ([place])  Long Term Goal(s): Improvement in symptoms so as ready for discharge Improvement in symptoms so as ready for discharge   Short Term Goals: Ability to identify changes in lifestyle to reduce recurrence of condition will improve Ability to verbalize feelings will improve Ability to disclose and discuss suicidal ideas Ability to demonstrate self-control will improve Ability to identify changes in lifestyle to reduce recurrence of condition will improve Ability to identify and develop effective coping behaviors will improve Compliance with prescribed medications will improve Ability to identify triggers associated with substance abuse/mental health issues will improve     Medication Management: Evaluate patient's response, side effects, and tolerance of medication regimen.  Therapeutic Interventions: 1 to 1 sessions, Unit Group sessions and Medication administration.  Evaluation of Outcomes: Met   RN Treatment Plan for Primary Diagnosis: Alcohol intoxication ([place]) Long Term Goal(s): Knowledge of disease and therapeutic regimen to maintain health will improve  Short Term Goals: Ability to remain free from injury will improve, Ability to verbalize feelings will improve and Ability to disclose and discuss suicidal ideas  Medication Management: RN will administer medications as ordered by provider, will assess and evaluate patient's response and provide education to patient for prescribed  medication. RN will report any adverse and/or side effects to prescribing provider.  Therapeutic Interventions: 1 on 1 counseling sessions, Psychoeducation, Medication administration, Evaluate responses to treatment, Monitor vital signs and CBGs as ordered, Perform/monitor CIWA, COWS, AIMS and Fall Risk screenings as ordered, Perform wound care treatments as ordered.  Evaluation of Outcomes: Met   LCSW Treatment Plan for Primary Diagnosis: Alcohol intoxication ([place]) Long Term Goal(s): Safe transition to appropriate next level of care at discharge, Engage patient in therapeutic group addressing interpersonal concerns.  Short Term Goals: Engage patient in aftercare planning with referrals and resources, Facilitate patient progression through stages of change regarding substance use diagnoses and concerns and Identify triggers associated with mental health/substance abuse issues  Therapeutic Interventions: Assess for all discharge needs, 1 to 1 time with Social worker, Explore available resources and support systems, Assess for adequacy in community support network, Educate family and significant other(s) on suicide prevention, Complete Psychosocial Assessment, Interpersonal group therapy.  Evaluation of Outcomes: Met   Progress in Treatment: Attending groups: Yes  Participating in groups: Yes Taking medication as prescribed: Yes. Toleration medication: Yes. Family/Significant other contact made: SPE completed with pt's father.  Patient understands diagnosis: Yes. Discussing patient identified problems/goals with staff: Yes. Medical problems  stabilized or resolved: Yes. Denies suicidal/homicidal ideation: Yes. Issues/concerns per patient self-inventory: No. Other: n/a   New problem(s) identified: Yes, Describe:  pt demonstrates minimal insight at this time. requesting to return home.  New Short Term/Long Term Goal(s): detox; medication stabilization; development of comprehensive mental  wellness/sobriety plan.   Discharge Plan or Barriers: Follow-up with PCP Dr. Dema Severin --[facility] made and [facility] assessment made for next week. Pt also provided with AA/NA information and Mental Health Association information.   Reason for Continuation of Hospitalization: none  Estimated Length of Stay: discharge today  Attendees: Patient: 06/02/2016 9:46 AM  Physician: Dr. Daron Offer MD 06/02/2016 9:46 AM  Nursing: Santiago Glad RN; Rubin Payor 06/02/2016 9:46 AM  RN Care Manager: Lars Pinks CM 06/02/2016 9:46 AM  Social Worker: Maxie Better, LCSW 06/02/2016 9:46 AM  Recreational Therapist: x 06/02/2016 9:46 AM  Other: Lindell Spar NP; Samuel Jester NP 06/02/2016 9:46 AM  Other:  06/02/2016 9:46 AM  Other: 06/02/2016 9:46 AM    Scribe for Treatment Team: Bret Harte, LCSW 06/02/2016 9:46 AM

## 2016-06-02 NOTE — Discharge Summary (Signed)
Physician Discharge Summary Note  Patient:  Richard Larson is an 21 y.o., male MRN:  [id number] DOB:  01-10-1996 Patient phone:  [phone] (home)  Patient address:   [address] [place] [address],  Total Time spent with patient: Greater than 30 minutes  Date of Admission:  05/30/2016  Date of Discharge: 06-02-16  Reason for Admission: Suicidal/homicidal threats, alcohol intoxication.  Principal Problem: Alcohol use disorder with alcohol induced mood disorder.  Discharge Diagnoses: Patient Active Problem List   Diagnosis Date Noted  . Alcohol abuse with alcohol-induced mood disorder (HCC) [F10.14] 05/30/2016  . Acute encephalopathy [G93.40] 05/29/2016  . Acute respiratory failure (HCC) [J96.00] 05/29/2016  . Hypokalemia [E87.6] 05/29/2016  . Alcoholic intoxication with complication (HCC) [F10.929]   . Alcohol intoxication (HCC) [F10.929] 05/28/2016  . Bipolar 1 disorder (HCC) [F31.9] 05/28/2016  . Polysubstance abuse [F19.10] 05/28/2016  . Suicidal behavior with attempted self-injury [T14.91XA] 05/28/2016  . Involuntary commitment [Z04.6] 05/28/2016  . Aspiration pneumonia (HCC) [J69.0] 05/28/2016   Past Psychiatric History: Alcoholism, Bipolar 1 disorder.  Past Medical History:  Past Medical History:  Diagnosis Date  . Bipolar 1 disorder (HCC)   . Drug abuse   . Suicide attempt    History reviewed. No pertinent surgical history.  Family History: History reviewed. No pertinent family history.  Family Psychiatric  History: See H&P  Social History:  History  Alcohol Use  . Yes    Comment: 770 ml; BAL was 595     History  Drug Use No    Social History   Social History  . Marital status: Single    Spouse name: N/A  . Number of children: N/A  . Years of education: N/A   Occupational History  . unemployed    Social History Main Topics  . Smoking status: Current Every Day Smoker    Packs/day: 0.50    Types: Cigarettes  . Smokeless tobacco: Never  Used  . Alcohol use Yes     Comment: 770 ml; BAL was 595  . Drug use: No  . Sexual activity: Yes   Other Topics Concern  . None   Social History Narrative  . None   Hospital Course:  This is the first inpatient psychiatric admission for this 21 year old Caucasian male with hx of Major depressive disorder & anxiety disorder. He reports that has been receiving mental health care from the [facility] in [place], [place]. He is being admitted to the [facility] adult unit with complaints of Alcohol intoxication. His BAL on admission was 585 & UDS negative for all other substances. Chart review indicated hx of Bipolar disorder & non-compliance to his medication regimen. Allegedly, patient had an argument with father, made a statement about killing himself or someone else, then will place the head on his father's porch. On attempting to IVC patient to come to the hospital, he was found unresponsive near a pool of vomit & signs that he may have been drinking heavily.   During this assessment, Richard Larson reports, "I drank a lot of liquor that I had bought the day prior. I passed out on the coach. My father found me & told me that I must go to the hospital. I have not been drinking a lot. This time, it was a spur of the moment. I want to straighten my life. I do not want to be like my mother. She is an alcoholic & has drug problems. She was caught with drugs & now in prison for drug possession.  I was diagnosed with depression & anxiety in 2013. I was put on Xanax &  Lexapro. I have never attempted suicide & no hx in my family. I don't hear voices or see things. I sleep well for the most part. The only problem I have is back pains. Right now, I'm not depressed or having withdrawal symptoms. I have been going to the [facility] in [place], [place] to meet with my psychiatrist on regular basis. I want to do better for my 2 children.  Richard Larson was admitted to the [facility] adult unit for crisis management due to suspected  suicide attempt after an argument with father. He apparently threatened to kill himself & other persons & will place their head on his father's porch. He was found intoxicated in a pool of his own vomit. He was in need of alcohol detoxification/mood stabilizations. After evaluation of his symptoms, Richard Larson was started on Ativan detox regime on a tapering dose format. This he received briefly as he was not presenting with any harsh substance withdrawal symptoms. He was treated with other medication regimen with their indications & side effects explained to him. His other pre-existing medical problems (sinusitis) were identified & treated accordingly by resuming his home medications as deemed appropriate.  During the course of his hosptalization, Richard Larson's improvement was monitored by observation & his daily report of symptom reduction noted. His emotional & mental status were monitored by daily self inventory reports completed by him & the clinical staff. He reported continued improvement on daily basis & denied any new concerns. He was encouraged to attend groups to help with recognizing triggers of his emotional crises & ways to cope better with them after discharge.  Richard Larson was opposed to any medication changes and stated clearly that he would resume taking lexapro and xanax regularly when discharged from the hospital.  He was open to information and education about the habit forming nature of xanax, but remained quite committed to resuming this form of therapy for his anxiety and panic episodes.       Richard Larson was evaluated by the treatment team on daily basis for mood stability and plans for continued recovery after discharge. He was offered further treatment options upon discharge on an outpatient basis as noted below. He was encouraged to maintain satisfactory support network & stable home environment to help him achieve/maintain stable mental health. He was instructed & encouraged to adhere to his  medication regimen as recommended by his treatment team.    Upon discharge, he was noted to be both mentally & medically stable denying suicidal/homicidal ideation, auditory/visual/tactile hallucinations, delusional thoughts, paranoia or substance withdrawal symptoms. He left [facility] with all personal belongings in no distress. Transportation per his arrangement (father).   Physical Findings: AIMS: Facial and Oral Movements Muscles of Facial Expression: None, normal Lips and Perioral Area: None, normal Jaw: None, normal Tongue: None, normal,Extremity Movements Upper (arms, wrists, hands, fingers): None, normal Lower (legs, knees, ankles, toes): None, normal, Trunk Movements Neck, shoulders, hips: None, normal, Overall Severity Severity of abnormal movements (highest score from questions above): None, normal Incapacitation due to abnormal movements: None, normal Patient's awareness of abnormal movements (rate only patient's report): No Awareness, Dental Status Current problems with teeth and/or dentures?: No Does patient usually wear dentures?: No  CIWA:  CIWA-Ar Total: 1 COWS:     Musculoskeletal: Strength & Muscle Tone: within normal limits Gait & Station: normal Patient leans: N/A  Psychiatric Specialty Exam: Physical Exam  Constitutional: He appears well-developed.  HENT:  Head: Normocephalic.  Eyes: Pupils are equal, round, and reactive to light.  Neck: Normal range of motion.  Cardiovascular: Normal rate.   Respiratory: Effort normal.  GI: Soft.  Genitourinary:  Genitourinary Comments: Deferred  Musculoskeletal: Normal range of motion.  Neurological: He is alert.  Skin: Skin is warm.    Review of Systems  Constitutional: Negative.   HENT: Negative.   Eyes: Negative.   Respiratory: Negative.   Cardiovascular: Negative.   Gastrointestinal: Negative.   Genitourinary: Negative.   Musculoskeletal: Negative.   Skin: Negative.   Neurological: Negative.    Endo/Heme/Allergies: Negative.   Psychiatric/Behavioral: Positive for depression (Stable) and substance abuse (Hx. Alcohol dependence). Negative for hallucinations, memory loss and suicidal ideas. The patient has insomnia (Stable). The patient is not nervous/anxious.     Blood pressure 123/69, pulse 96, temperature 98 F (36.7 C), temperature source Oral, resp. rate 18, height 5\' 10"  (1.778 m), weight 82.1 kg (181 lb), SpO2 100 %.Body mass index is 25.97 kg/m.  See Md's SRA   Have you used any form of tobacco in the last 30 days? (Cigarettes, Smokeless Tobacco, Cigars, and/or Pipes): Yes  Has this patient used any form of tobacco in the last 30 days? (Cigarettes, Smokeless Tobacco, Cigars, and/or Pipes): Yes, provided with a nicotine patch prescription.  Blood Alcohol level:  Lab Results  Component Value Date   ETH 585 (HH) 05/28/2016   Metabolic Disorder Labs:  No results found for: HGBA1C, MPG No results found for: PROLACTIN No results found for: CHOL, TRIG, HDL, CHOLHDL, VLDL, LDLCALC  See Psychiatric Specialty Exam and Suicide Risk Assessment completed by Attending Physician prior to discharge.  Discharge destination:  Home  Is patient on multiple antipsychotic therapies at discharge:  No   Has Patient had three or more failed trials of antipsychotic monotherapy by history:  No  Recommended Plan for Multiple Antipsychotic Therapies: NA  Allergies as of 06/02/2016   No Known Allergies     Medication List    STOP taking these medications   ALPRAZolam 0.5 MG tablet Commonly known as:  XANAX   diclofenac sodium 1 % Gel Commonly known as:  VOLTAREN   metoprolol tartrate 25 MG tablet Commonly known as:  LOPRESSOR     TAKE these medications     Indication  amoxicillin-clavulanate 875-125 MG tablet Commonly known as:  AUGMENTIN Take 1 tablet by mouth every 12 (twelve) hours. For infection (Sinusitis). What changed:  additional instructions  Indication:  Sinus  Irritation and Congestion   escitalopram 20 MG tablet Commonly known as:  LEXAPRO Take 1 tablet (20 mg total) by mouth daily. For depression Start taking on:  06/03/2016 What changed:  additional instructions  Indication:  Major Depressive Disorder   hydrOXYzine 25 MG tablet Commonly known as:  ATARAX/VISTARIL Take 1 tablet (25 mg) four times daily as needed: For anxiety  Indication:  Anxiety Neurosis   nicotine 21 mg/24hr patch Commonly known as:  NICODERM CQ - dosed in mg/24 hours Place 1 patch (21 mg total) onto the skin daily. For smoking cessation Start taking on:  06/03/2016  Indication:  Nicotine Addiction   traZODone 50 MG tablet Commonly known as:  DESYREL Take 1 tablet (50 mg) at bedtime: For sleep  Indication:  Trouble Sleeping      Follow-up Information    [facility] Follow up on 06/06/2016.   Why:  Hospital follow-up appt with Dr. Cliffton AstersWhite on Monday, 06/06/16 at 10:45AM. Please arrive 15 minutes early to this appt.  Thank you.  Contact information: ATTN: Dr. Cliffton Asters [address] [place], [place] [address] Phone: [phone] Fax: [phone]       [facility] Follow up on 06/09/2016.   Why:  Appt on this time at 8:00AM. Please bring insurance card and copay for any mental health services. Thank you.  Contact information: [address] [place] [address] [phone]          Follow-up recommendations: Activity:  As tolerated Diet: As recommended by your primary care doctor. Keep all scheduled follow-up appointments as recommended.  Comments: Patient is instructed prior to discharge to: Take all medications as prescribed by his/her mental healthcare provider. Report any adverse effects and or reactions from the medicines to his/her outpatient provider promptly. Patient has been instructed & cautioned: To not engage in alcohol and or illegal drug use while on prescription medicines. In the event of worsening symptoms, patient is  instructed to call the crisis hotline, 911 and or go to the nearest ED for appropriate evaluation and treatment of symptoms. To follow-up with his/her primary care provider for your other medical issues, concerns and or health care needs.    Signed: Sanjuana Kava, NP, PMHNP, FNP-BC 06/02/2016, 10:11 AM

## 2016-06-02 NOTE — BHH Suicide Risk Assessment (Signed)
[facility]   Principal Problem: Alcohol intoxication [facility](HCC) Discharge Diagnoses:  Patient Active Problem List   Diagnosis Date Noted  . Alcohol abuse with alcohol-induced mood disorder (HCC) [F10.14] 05/30/2016  . Acute encephalopathy [G93.40] 05/29/2016  . Acute respiratory failure (HCC) [J96.00] 05/29/2016  . Hypokalemia [E87.6] 05/29/2016  . Alcoholic intoxication with complication (HCC) [F10.929]   . Alcohol intoxication (HCC) [F10.929] 05/28/2016  . Bipolar 1 disorder (HCC) [F31.9] 05/28/2016  . Polysubstance abuse [F19.10] 05/28/2016  . Suicidal behavior with attempted self-injury [T14.91XA] 05/28/2016  . Involuntary commitment [Z04.6] 05/28/2016  . Aspiration pneumonia (HCC) [J69.0] 05/28/2016    Total Time spent with patient: 20 minutes  Musculoskeletal: Strength & Muscle Tone: within normal limits Gait & Station: normal Patient leans: N/A  Psychiatric Specialty Exam: Review of Systems  Constitutional: Negative.   HENT: Negative.   Eyes: Negative.   Respiratory: Negative.   Cardiovascular: Negative.   Gastrointestinal: Negative.   Genitourinary: Negative.   Musculoskeletal: Negative.   Skin: Negative.   Neurological: Negative.   Endo/Heme/Allergies: Negative.   Psychiatric/Behavioral: The patient is nervous/anxious.     Blood pressure 123/69, pulse 96, temperature 98 F (36.7 C), temperature source Oral, resp. rate 18, height 5\' 10"  (1.778 m), weight 82.1 kg (181 lb), SpO2 100 %.Body mass index is 25.97 kg/m.  General Appearance: Casual and Well Groomed  Eye Contact::  Good  Speech:  Clear and Coherent409  Volume:  Normal  Mood:  Euthymic  Affect:  Appropriate and Congruent  Thought Process:  Coherent  Orientation:  Full (Time, Place, and Person)  Thought Content:  Logical  Suicidal Thoughts:  No  Homicidal Thoughts:  No  Memory:  Immediate;   Good Recent;   Good Remote;   Good  Judgement:  Fair  Insight:  Fair   Psychomotor Activity:  Normal  Concentration:  Good  Recall:  Good  Fund of Knowledge:Good  Language: Good  Akathisia:  Negative  Handed:    AIMS (if indicated):     Assets:  Communication Skills Desire for Improvement Housing Social Support Vocational/Educational  Sleep:  Number of Hours: 6.75  Cognition: WNL  ADL's:  Intact   Mental Status Per Nursing Assessment::   On Admission:  NA  Demographic Factors:  Male, Adolescent or young adult and Access to firearms  Loss Factors: Financial problems/change in socioeconomic status  Historical Factors: Family history of mental illness or substance abuse  Risk Reduction Factors:   Responsible for children under 21 years of age, Sense of responsibility to family, Religious beliefs about death, Employed, Living with another person, especially a relative and Positive social support  Continued Clinical Symptoms:  Panic Attacks Alcohol/Substance Abuse/Dependencies  Cognitive Features That Contribute To Risk:  None    Suicide Risk:  Minimal: No identifiable suicidal ideation.  Patients presenting with no risk factors but with morbid ruminations; may be classified as minimal risk based on the severity of the depressive symptoms  Follow-up Information    [facility] Follow up on 06/06/2016.   Why:  Hospital follow-up appt with Dr. Cliffton AstersWhite on Monday, 06/06/16 at 10:45AM. Please arrive 15 minutes early to this appt. Thank you.  Contact information: ATTN: Dr. Cliffton AstersWhite [address] [place], [place] [address] Phone: [phone] Fax: [phone]       [facility] Follow up on 06/09/2016.   Why:  Appt on this time at 8:00AM. Please bring insurance card and copay for any mental health services. Thank you.  Contact information:  [address] [place] [address] [phone]          Plan Of Care/Follow-up recommendations:  Activity:  resume normal activity Diet:  resume normal diet  Burnard Leigh, MD 06/02/2016, 10:08 AM

## 2016-06-02 NOTE — Progress Notes (Signed)
  [facility]  Will you be returning to the same living situation after discharge:  Yes,  home with dad At discharge, do you have transportation home?: Yes,  dad will pick him up at 11:30AM Do you have the ability to pay for your medications: Yes,  [facility]  Release of information consent forms completed and submitted to medical records by CSW.  Patient to Follow up at: Follow-up Information    [facility] Follow up on 06/06/2016.   Why:  Hospital follow-up appt with Dr. Cliffton AstersWhite on Monday, 06/06/16 at 10:45AM. Please arrive 15 minutes early to this appt. Thank you.  Contact information: ATTN: Dr. Cliffton AstersWhite [address] [place], [place] [address] Phone: [phone] Fax: [phone]       [facility] Follow up on 06/09/2016.   Why:  Appt on this time at 8:00AM. Please bring insurance card and copay for any mental health services. Thank you.  Contact information: [address] [place] [address] [phone]           Next level of care provider has access to [facility]:no  Safety Planning and Suicide Prevention discussed: Yes,  SPE completed with pt's father; SPI pamphlet also provided to pt along with Mobile Crisis information.  Have you used any form of tobacco in the last 30 days? (Cigarettes, Smokeless Tobacco, Cigars, and/or Pipes): Yes  Has patient been referred to the Quitline?: Patient refused referral  Patient has been referred for addiction treatment: Yes  Hammad Finkler N Smart LCSW 06/02/2016, 9:45 AM

## 2016-06-03 LAB — HIV ANTIBODY (ROUTINE TESTING W REFLEX): HIV Screen 4th Generation wRfx: NONREACTIVE

## 2016-06-03 LAB — RPR: RPR Ser Ql: NONREACTIVE

## 2016-06-03 LAB — HEPATITIS C ANTIBODY

## 2016-06-03 LAB — HEPATITIS B SURFACE ANTIGEN: HEP B S AG: NEGATIVE

## 2016-06-06 LAB — BENZODIAZEPINES,MS,WB/SP RFX
7-Aminoclonazepam: NEGATIVE ng/mL
Alprazolam: NEGATIVE ng/mL
Benzodiazepines Confirm: POSITIVE
Chlordiazepoxide: NEGATIVE ng/mL
Clonazepam: NEGATIVE ng/mL
DESMETHYLCHLORDIAZEPOXIDE: NEGATIVE ng/mL
DESMETHYLDIAZEPAM: NEGATIVE ng/mL
Desalkylflurazepam: NEGATIVE ng/mL
Diazepam: NEGATIVE ng/mL
Flurazepam: NEGATIVE ng/mL
LORAZEPAM: 38.2 ng/mL
Midazolam: NEGATIVE ng/mL
OXAZEPAM: NEGATIVE ng/mL
Temazepam: NEGATIVE ng/mL
Triazolam: NEGATIVE ng/mL

## 2016-06-06 LAB — DRUG SCREEN 10 W/CONF, SERUM
Amphetamines, IA: NEGATIVE ng/mL
BENZODIAZEPINES, IA: POSITIVE ng/mL
Barbiturates, IA: NEGATIVE ug/mL
Cocaine & Metabolite, IA: NEGATIVE ng/mL
Methadone, IA: NEGATIVE ng/mL
OPIATES, IA: NEGATIVE ng/mL
OXYCODONES, IA: NEGATIVE ng/mL
Phencyclidine, IA: NEGATIVE ng/mL
Propoxyphene, IA: NEGATIVE ng/mL
THC(Marijuana) Metabolite, IA: NEGATIVE ng/mL

## 2016-08-09 ENCOUNTER — Telehealth (HOSPITAL_COMMUNITY): Payer: Self-pay | Admitting: *Deleted

## 2016-08-09 NOTE — Telephone Encounter (Signed)
phone call from patient to schedule an appointment.  He could not confirm insurance information.   He said he no longer see Kathlen Brunswick at [facility], the reason is he needs a higher dose of Xanax and she would not prescribe.  When told that our provider may not prescribe Xanax, he said he would call back later.

## 2016-08-13 DIAGNOSIS — K047 Periapical abscess without sinus: Secondary | ICD-10-CM | POA: Diagnosis not present

## 2016-08-13 DIAGNOSIS — X58XXXA Exposure to other specified factors, initial encounter: Secondary | ICD-10-CM | POA: Diagnosis not present

## 2016-08-13 DIAGNOSIS — S01311A Laceration without foreign body of right ear, initial encounter: Secondary | ICD-10-CM | POA: Diagnosis not present

## 2016-09-30 DIAGNOSIS — J180 Bronchopneumonia, unspecified organism: Secondary | ICD-10-CM | POA: Diagnosis not present

## 2016-10-21 DIAGNOSIS — G4700 Insomnia, unspecified: Secondary | ICD-10-CM | POA: Diagnosis not present

## 2017-03-13 DIAGNOSIS — Z720 Tobacco use: Secondary | ICD-10-CM | POA: Diagnosis not present

## 2017-03-13 DIAGNOSIS — J069 Acute upper respiratory infection, unspecified: Secondary | ICD-10-CM | POA: Diagnosis not present

## 2017-03-13 DIAGNOSIS — R079 Chest pain, unspecified: Secondary | ICD-10-CM | POA: Diagnosis not present

## 2017-05-26 DIAGNOSIS — R109 Unspecified abdominal pain: Secondary | ICD-10-CM | POA: Diagnosis not present

## 2017-07-04 ENCOUNTER — Encounter (HOSPITAL_COMMUNITY): Payer: Self-pay | Admitting: *Deleted

## 2017-07-04 ENCOUNTER — Emergency Department (HOSPITAL_COMMUNITY)
Admission: EM | Admit: 2017-07-04 | Discharge: 2017-07-04 | Disposition: A | Discharge status: Home / Self Care | Payer: 59 | Attending: Emergency Medicine | Admitting: Emergency Medicine

## 2017-07-04 ENCOUNTER — Emergency Department (HOSPITAL_COMMUNITY): Payer: 59

## 2017-07-04 ENCOUNTER — Other Ambulatory Visit: Payer: Self-pay

## 2017-07-04 DIAGNOSIS — J069 Acute upper respiratory infection, unspecified: Secondary | ICD-10-CM | POA: Diagnosis not present

## 2017-07-04 DIAGNOSIS — R0789 Other chest pain: Secondary | ICD-10-CM | POA: Insufficient documentation

## 2017-07-04 DIAGNOSIS — R0602 Shortness of breath: Secondary | ICD-10-CM | POA: Diagnosis not present

## 2017-07-04 DIAGNOSIS — Z79899 Other long term (current) drug therapy: Secondary | ICD-10-CM | POA: Insufficient documentation

## 2017-07-04 DIAGNOSIS — R079 Chest pain, unspecified: Secondary | ICD-10-CM | POA: Diagnosis not present

## 2017-07-04 DIAGNOSIS — R0989 Other specified symptoms and signs involving the circulatory and respiratory systems: Secondary | ICD-10-CM | POA: Diagnosis not present

## 2017-07-04 DIAGNOSIS — R002 Palpitations: Secondary | ICD-10-CM | POA: Insufficient documentation

## 2017-07-04 DIAGNOSIS — F17210 Nicotine dependence, cigarettes, uncomplicated: Secondary | ICD-10-CM | POA: Diagnosis not present

## 2017-07-04 DIAGNOSIS — R0981 Nasal congestion: Secondary | ICD-10-CM | POA: Diagnosis not present

## 2017-07-04 DIAGNOSIS — R05 Cough: Secondary | ICD-10-CM | POA: Diagnosis present

## 2017-07-04 LAB — CBC
HCT: 50.8 % (ref 39.0–52.0)
Hemoglobin: 16.7 g/dL (ref 13.0–17.0)
MCH: 31.3 pg (ref 26.0–34.0)
MCHC: 32.9 g/dL (ref 30.0–36.0)
MCV: 95.3 fL (ref 78.0–100.0)
PLATELETS: 219 10*3/uL (ref 150–400)
RBC: 5.33 MIL/uL (ref 4.22–5.81)
RDW: 15.2 % (ref 11.5–15.5)
WBC: 5.4 10*3/uL (ref 4.0–10.5)

## 2017-07-04 LAB — BASIC METABOLIC PANEL
Anion gap: 12 (ref 5–15)
BUN: 9 mg/dL (ref 6–20)
CHLORIDE: 100 mmol/L — AB (ref 101–111)
CO2: 28 mmol/L (ref 22–32)
CREATININE: 0.95 mg/dL (ref 0.61–1.24)
Calcium: 9.8 mg/dL (ref 8.9–10.3)
GFR calc Af Amer: >60 mL/min (ref >60)
GLUCOSE: 99 mg/dL (ref 65–99)
Potassium: 4.4 mmol/L (ref 3.5–5.1)
SODIUM: 140 mmol/L (ref 135–145)

## 2017-07-04 LAB — TROPONIN I: Troponin I: <0.03 ng/mL (ref <0.03)

## 2017-07-04 MED ORDER — BENZONATATE 100 MG PO CAPS: 100.0000 mg | ORAL_CAPSULE | Freq: Three times a day (TID) | ORAL | 0 refills | Status: AC

## 2017-07-04 NOTE — ED Provider Notes (Signed)
Planes of intermittent chest pain lasting 20 minutes at a time, pleuritic in nature.  He is asymptomatic presently.  He is been having similar symptoms "for years" he presents today it is becoming more frequent over the past week.  He also reports cough productive of greenish sputum for the past 1 week.  He is presently asymptomatic.  On exam no distress, speaks in paragraphs.  Lungs clear to auscultation heart regular rate and rhythm no murmurs rubs abdomen nondistended nontender.  Extremities no edema.  I counseled patient for 5 minutes on smoking cessation.  Chest pain felt to be highly atypical for cardiac etiologies.   cough productive of greenish sputum consistent with bronchitis.  I counseled patient for 5 minutes on smoking cessation. chestX-ray viewed by me   Doug SouJacubowitz, Jeanette Rauth, MD 07/04/17 1820

## 2017-07-04 NOTE — ED Provider Notes (Signed)
[facility] EMERGENCY DEPARTMENT Provider Note   CSN: [id number] Arrival date & time: 07/04/17  1541     History   Chief Complaint Chief Complaint  Patient presents with  . Cough  . Chest Pain    HPI Richard Larson is a 22 y.o. male.  HPI   Patient is a 22 year old male with history of bipolar 1, drug abuse, suicide attempt, who presents the ED today complaining of chest pain that has ongoing intermittently for years, however worsened over the last 4-5 days.  Patient describes pain as sharp, stabbing pain to the mid chest.  Radiates to right anterior chest intermittently.  Episodes not provoked by exertion.  Episodes occur intermittently at any time, and last about 20 minutes at a time.  During episodes patient has pain with inspiration.  Pain not worse with movement or certain positions.  Intermittently has palpitations as well.  Episodes resolve on their own without intervention.  He has no current symptoms of chest pain, shortness of breath, or palpitations.  Last episode occurred last night while he was lying in bed.  Has not tried  taking anything for the pain.  Patient also notes productive cough for similar time period.  Reports yellow/green sputum, no hemoptysis.  Also with nasal congestion, chest congestion.  No ear pain or sore throat.  Does report URI symptoms 1 week prior to onset of the symptoms.  No fevers, diaphoresis, nausea, vomiting, abdominal pain, dizziness, lightheadedness, headaches.  Denies leg pain/swelling, hemoptysis, recent surgery/trauma, recent long travel, hormone use, personal hx of cancer, or hx of DVT/PE.   Past Medical History:  Diagnosis Date  . Bipolar 1 disorder (HCC)   . Drug abuse (HCC)   . Suicide attempt [facility])     Patient Active Problem List   Diagnosis Date Noted  . Alcohol abuse with alcohol-induced mood disorder (HCC) 05/30/2016  . Acute encephalopathy 05/29/2016  . Acute respiratory failure (HCC) 05/29/2016  . Hypokalemia 05/29/2016    . Alcoholic intoxication with complication (HCC)   . Alcohol intoxication (HCC) 05/28/2016  . Bipolar 1 disorder (HCC) 05/28/2016  . Polysubstance abuse (HCC) 05/28/2016  . Suicidal behavior with attempted self-injury (HCC) 05/28/2016  . Involuntary commitment 05/28/2016  . Aspiration pneumonia (HCC) 05/28/2016    History reviewed. No pertinent surgical history.     Home Medications    Prior to Admission medications   Medication Sig Start Date End Date Taking? Authorizing Provider  amoxicillin-clavulanate (AUGMENTIN) 875-125 MG tablet Take 1 tablet by mouth every 12 (twelve) hours. For infection (Sinusitis). 06/02/16   Armandina Stammer I, NP  benzonatate (TESSALON) 100 MG capsule Take 1 capsule (100 mg total) by mouth every 8 (eight) hours. 07/04/17   Latonja Bobeck S, PA-C  escitalopram (LEXAPRO) 20 MG tablet Take 1 tablet (20 mg total) by mouth daily. For depression 06/03/16   Armandina Stammer I, NP  hydrOXYzine (ATARAX/VISTARIL) 25 MG tablet Take 1 tablet (25 mg) four times daily as needed: For anxiety 06/02/16   Armandina Stammer I, NP  nicotine (NICODERM CQ - DOSED IN MG/24 HOURS) 21 mg/24hr patch Place 1 patch (21 mg total) onto the skin daily. For smoking cessation 06/03/16   Armandina Stammer I, NP  traZODone (DESYREL) 50 MG tablet Take 1 tablet (50 mg) at bedtime: For sleep 06/02/16   Sanjuana Kava, NP    Family History No family history on file.  Social History Social History   Tobacco Use  . Smoking status: Current Every Day Smoker  Packs/day: 0.50    Types: Cigarettes  . Smokeless tobacco: Never Used  Substance Use Topics  . Alcohol use: Yes    Comment: socially   . Drug use: No     Allergies   Patient has no known allergies.   Review of Systems Review of Systems  Constitutional: Negative for chills, diaphoresis and fever.  HENT: Positive for congestion, rhinorrhea and sinus pressure. Negative for ear pain and sore throat.   Eyes: Negative for visual disturbance.   Respiratory: Positive for cough and shortness of breath. Negative for wheezing.   Cardiovascular: Positive for chest pain and palpitations. Negative for leg swelling.  Gastrointestinal: Negative for abdominal pain, constipation, diarrhea, nausea and vomiting.  Genitourinary: Negative for decreased urine volume and flank pain.  Musculoskeletal: Positive for back pain (chronic, unchanged). Negative for arthralgias.  Skin: Negative for rash.  Neurological: Negative for dizziness, light-headedness and headaches.  All other systems reviewed and are negative.    Physical Exam Updated Vital Signs BP 119/82 (BP Location: Right Arm)   Pulse 65   Temp 98.2 F (36.8 C) (Oral)   Resp 20   Ht 5\' 11"  (1.803 m)   Wt 72.6 kg (160 lb)   SpO2 98%   BMI 22.32 kg/m   Physical Exam  Constitutional: He appears well-developed and well-nourished. He does not appear ill.  Nontoxic, texting on his phone in the room in no acute distress  HENT:  Head: Normocephalic and atraumatic.  No pharyngeal erythema no tonsillar exudates, uvula midline.  Bilateral TMs without erythema or effusion  Eyes: Conjunctivae and EOM are normal. Pupils are equal, round, and reactive to light.  Neck: Neck supple. No JVD present. No tracheal deviation present.  Cardiovascular: Normal rate, regular rhythm and intact distal pulses.  No murmur heard. Pulmonary/Chest: Effort normal and breath sounds normal. No respiratory distress. He has no decreased breath sounds. He has no wheezes. He has no rhonchi. He has no rales.  Abdominal: Soft. There is no tenderness.  Musculoskeletal: Normal range of motion. He exhibits no edema.  No calf TTP, erythema, swelling.  Neurological: He is alert.  Skin: Skin is warm and dry. Capillary refill takes less than 2 seconds.  Psychiatric: He has a normal mood and affect.  Nursing note and vitals reviewed.    ED Treatments / Results  Labs (all labs ordered are listed, but only abnormal  results are displayed) Labs Reviewed  BASIC METABOLIC PANEL - Abnormal; Notable for the following components:      Result Value   Chloride 100 (*)    All other components within normal limits  CBC  TROPONIN I    EKG  EKG Interpretation  Date/Time:  Tuesday July 04 2017 16:04:03 EDT Ventricular Rate:  66 PR Interval:  110 QRS Duration: 84 QT Interval:  356 QTC Calculation: 373 R Axis:   64 Text Interpretation:  Sinus rhythm with short PR Septal infarct , age undetermined Abnormal ECG Since last tracing rate slower Confirmed by Doug Sou [id number]) on 07/04/2017 5:45:59 PM       Radiology Dg Chest 2 View  Result Date: 07/04/2017 CLINICAL DATA:  Initial evaluation for acute chest pain. EXAM: CHEST - 2 VIEW COMPARISON:  Prior radiograph from 05/28/2016. FINDINGS: Cardiac and mediastinal silhouettes are stable in size and contour, and remain within normal limits. Trach air column midline and patent. Lungs well inflated. No focal infiltrates. No pulmonary edema or pleural effusion. No pneumothorax. No acute osseous abnormality. IMPRESSION: No active cardiopulmonary  disease. Electronically Signed   By: Rise MuBenjamin  McClintock M.D.   On: 07/04/2017 16:23    Procedures Procedures (including critical care time)  Medications Ordered in ED Medications - No data to display   Initial Impression / Assessment and Plan / ED Course  I have reviewed the triage vital signs and the nursing notes.  Pertinent labs & imaging results that were available during my care of the patient were reviewed by me and considered in my medical decision making (see chart for details).     Final Clinical Impressions(s) / ED Diagnoses   Final diagnoses:  Upper respiratory tract infection, unspecified type  Atypical chest pain    Pt CXR negative for acute infiltrate. Patients symptoms are consistent with URI, likely viral etiology. Pain in chest  sounds most suggestive of pleurisy.  Doubt pericarditis,  pain nonpositional and not constant. Doubt myocarditis. Not likely of urgent/emergent cardiac or pulmonary etiology d/t presentation, PERC negative, VSS, no tracheal deviation, no JVD or new murmur, RRR, breath sounds equal bilaterally, EKG without acute abnormalities, negative troponin. labwork negative. Pt has been advised to return to the ED if CP becomes exertional, associated with diaphoresis or nausea, radiates to left jaw/arm, worsens or becomes concerning in any way. Pt appears reliable for follow up and is agreeable to discharge. Pt will be discharged with symptomatic treatment.  Verbalizes understanding and is agreeable with plan. Pt is hemodynamically stable & in NAD prior to dc.  Case has been discussed with Dr. Ethelda ChickJacubowitz who evaluated patient and agrees that chest pain does not sound cardiac in nature and patient can safely be discharged to follow-up as an outpatient with primary care.   ED Discharge Orders        Ordered    benzonatate (TESSALON) 100 MG capsule  Every 8 hours     07/04/17 1829       Karrie MeresCouture, Domonick Sittner S, PA-C 07/04/17 1837    Doug SouJacubowitz, Sam, MD 07/05/17 [phone]

## 2017-07-04 NOTE — ED Triage Notes (Signed)
Pt c/o intermittent mid chest pain that radiates to the right chest and clear/green productive cough. Denies n/v, fever, dizziness.

## 2017-07-04 NOTE — Discharge Instructions (Signed)
You were given a prescription for benzonatate which is a cough medication that you can take up to 3 times a day for your cough.  This medication may make you drowsy so you should avoid driving, operating machinery, or working while taking it.  You should make sure that you are staying hydrated while you are taking this medication.  Your symptoms are likely viral in nature and will resolve with supportive measures over the next week.  Please follow-up with primary care within the next 5-7 days for reevaluation and to help with smoking cessation.  Please return to the emergency department for any new or worsening symptoms including any persistent chest pain, shortness of breath, fevers.

## 2017-08-08 DIAGNOSIS — A499 Bacterial infection, unspecified: Secondary | ICD-10-CM | POA: Diagnosis not present

## 2017-09-02 ENCOUNTER — Other Ambulatory Visit: Payer: Self-pay

## 2017-09-02 ENCOUNTER — Emergency Department (HOSPITAL_COMMUNITY)
Admission: EM | Admit: 2017-09-02 | Discharge: 2017-09-03 | Disposition: A | Discharge status: Home / Self Care | Payer: 59 | Attending: Emergency Medicine | Admitting: Emergency Medicine

## 2017-09-02 ENCOUNTER — Encounter (HOSPITAL_COMMUNITY): Payer: Self-pay | Admitting: Emergency Medicine

## 2017-09-02 DIAGNOSIS — R0789 Other chest pain: Secondary | ICD-10-CM | POA: Diagnosis not present

## 2017-09-02 DIAGNOSIS — Z79899 Other long term (current) drug therapy: Secondary | ICD-10-CM | POA: Diagnosis not present

## 2017-09-02 DIAGNOSIS — R200 Anesthesia of skin: Secondary | ICD-10-CM | POA: Insufficient documentation

## 2017-09-02 DIAGNOSIS — R0602 Shortness of breath: Secondary | ICD-10-CM | POA: Diagnosis not present

## 2017-09-02 DIAGNOSIS — F17210 Nicotine dependence, cigarettes, uncomplicated: Secondary | ICD-10-CM | POA: Diagnosis not present

## 2017-09-02 MED ORDER — LORAZEPAM 1 MG PO TABS
1.0000 mg | ORAL_TABLET | Freq: Once | ORAL | Status: AC
  Administered 2017-09-03: 1 mg via ORAL
  Filled 2017-09-02: qty 1

## 2017-09-02 NOTE — ED Provider Notes (Signed)
[facility] EMERGENCY DEPARTMENT Provider Note   CSN: [id number] Arrival date & time: 09/02/17  2327     History   Chief Complaint Chief Complaint  Patient presents with  . Numbness    HPI Richard Larson is a 22 y.o. male.  Patient is a 22 year old male with past medical history of bipolar disorder, substance abuse.  Presents today for evaluation of multiple complaints.  He reports pain in his dentition and throat is been ongoing for several weeks.  He has been on 2 weeks of antibiotics however is not helping.  He also reports discomfort in his chest and back extending into his abdomen that occurs intermittently.  He denies any bowel or bladder complaints.  He reports this evening becoming short of breath and feeling numbness in both arms.  He reports a history of alcohol and substance abuse which he reports he is attempting to cut back.     Past Medical History:  Diagnosis Date  . Bipolar 1 disorder (HCC)   . Drug abuse (HCC)   . Suicide attempt [facility])     Patient Active Problem List   Diagnosis Date Noted  . Alcohol abuse with alcohol-induced mood disorder (HCC) 05/30/2016  . Acute encephalopathy 05/29/2016  . Acute respiratory failure (HCC) 05/29/2016  . Hypokalemia 05/29/2016  . Alcoholic intoxication with complication (HCC)   . Alcohol intoxication (HCC) 05/28/2016  . Bipolar 1 disorder (HCC) 05/28/2016  . Polysubstance abuse (HCC) 05/28/2016  . Suicidal behavior with attempted self-injury (HCC) 05/28/2016  . Involuntary commitment 05/28/2016  . Aspiration pneumonia (HCC) 05/28/2016    History reviewed. No pertinent surgical history.      Home Medications    Prior to Admission medications   Medication Sig Start Date End Date Taking? Authorizing Provider  amoxicillin-clavulanate (AUGMENTIN) 875-125 MG tablet Take 1 tablet by mouth every 12 (twelve) hours. For infection (Sinusitis). 06/02/16   Armandina Stammer I, NP  benzonatate (TESSALON) 100 MG capsule Take 1  capsule (100 mg total) by mouth every 8 (eight) hours. 07/04/17   Couture, Cortni S, PA-C  escitalopram (LEXAPRO) 20 MG tablet Take 1 tablet (20 mg total) by mouth daily. For depression 06/03/16   Armandina Stammer I, NP  hydrOXYzine (ATARAX/VISTARIL) 25 MG tablet Take 1 tablet (25 mg) four times daily as needed: For anxiety 06/02/16   Armandina Stammer I, NP  nicotine (NICODERM CQ - DOSED IN MG/24 HOURS) 21 mg/24hr patch Place 1 patch (21 mg total) onto the skin daily. For smoking cessation 06/03/16   Armandina Stammer I, NP  traZODone (DESYREL) 50 MG tablet Take 1 tablet (50 mg) at bedtime: For sleep 06/02/16   Sanjuana Kava, NP    Family History No family history on file.  Social History Social History   Tobacco Use  . Smoking status: Current Every Day Smoker    Packs/day: 0.50    Types: Cigarettes  . Smokeless tobacco: Never Used  Substance Use Topics  . Alcohol use: Yes    Comment: socially   . Drug use: No     Allergies   Patient has no known allergies.   Review of Systems Review of Systems  All other systems reviewed and are negative.    Physical Exam Updated Vital Signs BP (!) 159/98 (BP Location: Right Arm)   Pulse 75   Temp 98.2 F (36.8 C) (Oral)   Resp 17   Wt 72.6 kg (160 lb)   SpO2 98%   BMI 22.32 kg/m   Physical Exam  Constitutional: He is oriented to person, place, and time. He appears well-developed and well-nourished. No distress.  HENT:  Head: Normocephalic and atraumatic.  Mouth/Throat: Oropharynx is clear and moist. No oropharyngeal exudate.  Eyes: Pupils are equal, round, and reactive to light. EOM are normal.  Neck: Normal range of motion. Neck supple.  Cardiovascular: Normal rate and regular rhythm. Exam reveals no friction rub.  No murmur heard. Pulmonary/Chest: Effort normal and breath sounds normal. No respiratory distress. He has no wheezes. He has no rales.  Abdominal: Soft. Bowel sounds are normal. He exhibits no distension. There is no tenderness.    Musculoskeletal: Normal range of motion. He exhibits no edema.  Neurological: He is alert and oriented to person, place, and time. No cranial nerve deficit. He exhibits normal muscle tone. Coordination normal.  Skin: Skin is warm and dry. He is not diaphoretic.  Nursing note and vitals reviewed.    ED Treatments / Results  Labs (all labs ordered are listed, but only abnormal results are displayed) Labs Reviewed  CBC WITH DIFFERENTIAL/PLATELET  COMPREHENSIVE METABOLIC PANEL  TROPONIN I    EKG None  Radiology No results found.  Procedures Procedures (including critical care time)  Medications Ordered in ED Medications  LORazepam (ATIVAN) tablet 1 mg (has no administration in time range)     Initial Impression / Assessment and Plan / ED Course  I have reviewed the triage vital signs and the nursing notes.  Pertinent labs & imaging results that were available during my care of the patient were reviewed by me and considered in my medical decision making (see chart for details).  Patient presenting with multiple complaints, the etiology of which I am uncertain.  He reports intermittent pains in his chest, throat, mouth, and abdomen that have been occurring for several months.  He has been on antibiotics for 2 weeks for presumed dental infection.  Nothing today appears emergent on exam or from the results of his tests.  His LFTs are normal, there is no white count, and EKG and troponin are both reassuring.  It also appears as though he had some sort of anxiety attack this evening which caused the numbness in his arms.  He is feeling better after a dose of Ativan.  At this point, I see no indication for admission or further work-up.  I will advised him to take Prilosec for the next 2 weeks.  He is also requested a prescription for Xanax as he has been on this in the past.  He will be prescribed a small quantity of this which he can take if his symptoms worsen.  Final Clinical  Impressions(s) / ED Diagnoses   Final diagnoses:  None    ED Discharge Orders    None       Geoffery Lyons, MD 09/03/17 0150

## 2017-09-02 NOTE — ED Triage Notes (Signed)
Pt C/O bilateral arm numbness. Pt states he has also had sob associated with this.

## 2017-09-03 ENCOUNTER — Emergency Department (HOSPITAL_COMMUNITY): Payer: 59

## 2017-09-03 DIAGNOSIS — R0602 Shortness of breath: Secondary | ICD-10-CM | POA: Diagnosis not present

## 2017-09-03 LAB — COMPREHENSIVE METABOLIC PANEL
ALT: 36 U/L (ref 17–63)
ANION GAP: 10 (ref 5–15)
AST: 33 U/L (ref 15–41)
Albumin: 4.5 g/dL (ref 3.5–5.0)
Alkaline Phosphatase: 72 U/L (ref 38–126)
BUN: 12 mg/dL (ref 6–20)
CHLORIDE: 99 mmol/L — AB (ref 101–111)
CO2: 29 mmol/L (ref 22–32)
CREATININE: 0.92 mg/dL (ref 0.61–1.24)
Calcium: 9.6 mg/dL (ref 8.9–10.3)
GFR calc Af Amer: >60 mL/min (ref >60)
Glucose, Bld: 76 mg/dL (ref 65–99)
POTASSIUM: 4 mmol/L (ref 3.5–5.1)
SODIUM: 138 mmol/L (ref 135–145)
Total Bilirubin: 1.5 mg/dL — ABNORMAL HIGH (ref 0.3–1.2)
Total Protein: 7.8 g/dL (ref 6.5–8.1)

## 2017-09-03 LAB — CBC WITH DIFFERENTIAL/PLATELET
Basophils Absolute: 0 10*3/uL (ref 0.0–0.1)
Basophils Relative: 0 %
EOS ABS: 0.3 10*3/uL (ref 0.0–0.7)
EOS PCT: 4 %
HCT: 50 % (ref 39.0–52.0)
Hemoglobin: 17.4 g/dL — ABNORMAL HIGH (ref 13.0–17.0)
LYMPHS PCT: 26 %
Lymphs Abs: 2.1 10*3/uL (ref 0.7–4.0)
MCH: 32.4 pg (ref 26.0–34.0)
MCHC: 34.8 g/dL (ref 30.0–36.0)
MCV: 93.1 fL (ref 78.0–100.0)
MONO ABS: 0.9 10*3/uL (ref 0.1–1.0)
Monocytes Relative: 10 %
NEUTROS ABS: 4.9 10*3/uL (ref 1.7–7.7)
Neutrophils Relative %: 60 %
Platelets: 195 10*3/uL (ref 150–400)
RBC: 5.37 MIL/uL (ref 4.22–5.81)
RDW: 12.9 % (ref 11.5–15.5)
WBC: 8.3 10*3/uL (ref 4.0–10.5)

## 2017-09-03 LAB — TROPONIN I

## 2017-09-03 MED ORDER — ALPRAZOLAM 0.5 MG PO TABS: 0.5000 mg | ORAL_TABLET | Freq: Three times a day (TID) | ORAL | 0 refills | Status: AC | PRN

## 2017-09-03 NOTE — ED Notes (Signed)
Pt ambulatory to waiting room. Pt verbalized understanding of discharge instructions.   

## 2017-09-03 NOTE — Discharge Instructions (Signed)
Xanax as prescribed as needed for anxiety.  Prilosec 20 mg twice daily for the next 2 weeks.  Follow up with a primary doctor in the next 1 to 2 weeks for recheck, and return to the ER if symptoms significantly worsen or change.

## 2017-09-11 DIAGNOSIS — G4700 Insomnia, unspecified: Secondary | ICD-10-CM | POA: Diagnosis not present

## 2017-10-10 ENCOUNTER — Encounter (HOSPITAL_COMMUNITY): Payer: Self-pay | Admitting: Emergency Medicine

## 2017-10-10 ENCOUNTER — Emergency Department (HOSPITAL_COMMUNITY)
Admission: EM | Admit: 2017-10-10 | Discharge: 2017-10-11 | Disposition: A | Discharge status: Home / Self Care | Payer: 59 | Attending: Emergency Medicine | Admitting: Emergency Medicine

## 2017-10-10 ENCOUNTER — Other Ambulatory Visit: Payer: Self-pay

## 2017-10-10 DIAGNOSIS — Z79899 Other long term (current) drug therapy: Secondary | ICD-10-CM | POA: Diagnosis not present

## 2017-10-10 DIAGNOSIS — F319 Bipolar disorder, unspecified: Secondary | ICD-10-CM | POA: Diagnosis not present

## 2017-10-10 DIAGNOSIS — F17210 Nicotine dependence, cigarettes, uncomplicated: Secondary | ICD-10-CM | POA: Insufficient documentation

## 2017-10-10 DIAGNOSIS — F419 Anxiety disorder, unspecified: Secondary | ICD-10-CM | POA: Insufficient documentation

## 2017-10-10 DIAGNOSIS — K0889 Other specified disorders of teeth and supporting structures: Secondary | ICD-10-CM | POA: Diagnosis not present

## 2017-10-10 DIAGNOSIS — R0789 Other chest pain: Secondary | ICD-10-CM | POA: Diagnosis not present

## 2017-10-10 DIAGNOSIS — R05 Cough: Secondary | ICD-10-CM | POA: Diagnosis present

## 2017-10-10 NOTE — ED Triage Notes (Signed)
Pt states he has been having "anxiety attacks and freaking out when I am sleeping." Pt states he also has an abscess on his back that he "just wants to get taken care of."

## 2017-10-11 MED ORDER — NAPROXEN 250 MG PO TABS
500.0000 mg | ORAL_TABLET | Freq: Once | ORAL | Status: AC
  Administered 2017-10-11: 500 mg via ORAL
  Filled 2017-10-11: qty 2

## 2017-10-11 NOTE — Discharge Instructions (Addendum)
Were seen today for dental pain and infection.  It is very important that you follow-up with your dentist regarding ongoing antibiotics and/or definitive care.  There is no palpable abscess to drain at this time.

## 2017-10-11 NOTE — ED Provider Notes (Addendum)
[facility] EMERGENCY DEPARTMENT Provider Note   CSN: [id number] Arrival date & time: 10/10/17  2334     History   Chief Complaint Chief Complaint  Patient presents with  . Anxiety    HPI Richard Larson is a 22 y.o. male.  HPI  This is a 22 year old male with a history of bipolar disease, alcohol abuse, polysubstance abuse who presents with multiple complaints.  Patient initially states that he has ongoing and recurrent anxiety related to chest pain and hypoxia.  He states that since he was 13 he has had an oxygen requirement off and on.  He reports ongoing upper respiratory congestion and cough.  He reports cough is productive.  Reports chills without fevers.  He states "I get this every 2 years or so."  He has not seen his doctor this year.  Additionally, he is preoccupied with dental infection that he has had for over the last 3 months.  He reports taking multiple rounds of antibiotics but cannot tell me which antibiotics he has taken.  He reports pain over the right lower jawline and bleeding.  He has seen a dentist but "they will not pull the tooth until the infection has improved."  He "wants something to be done about it now."  Denies any difficulty swallowing.  Past Medical History:  Diagnosis Date  . Bipolar 1 disorder (HCC)   . Drug abuse (HCC)   . Suicide attempt [facility])     Patient Active Problem List   Diagnosis Date Noted  . Alcohol abuse with alcohol-induced mood disorder (HCC) 05/30/2016  . Acute encephalopathy 05/29/2016  . Acute respiratory failure (HCC) 05/29/2016  . Hypokalemia 05/29/2016  . Alcoholic intoxication with complication (HCC)   . Alcohol intoxication (HCC) 05/28/2016  . Bipolar 1 disorder (HCC) 05/28/2016  . Polysubstance abuse (HCC) 05/28/2016  . Suicidal behavior with attempted self-injury (HCC) 05/28/2016  . Involuntary commitment 05/28/2016  . Aspiration pneumonia (HCC) 05/28/2016    History reviewed. No pertinent surgical  history.      Home Medications    Prior to Admission medications   Medication Sig Start Date End Date Taking? Authorizing Provider  ALPRAZolam [name]) 0.5 MG tablet Take 1 tablet (0.5 mg total) by mouth 3 (three) times daily as needed for anxiety. 09/03/17   Geoffery Lyons, MD  amoxicillin-clavulanate (AUGMENTIN) 875-125 MG tablet Take 1 tablet by mouth every 12 (twelve) hours. For infection (Sinusitis). 06/02/16   Armandina Stammer I, NP  benzonatate (TESSALON) 100 MG capsule Take 1 capsule (100 mg total) by mouth every 8 (eight) hours. 07/04/17   Couture, Cortni S, PA-C  escitalopram (LEXAPRO) 20 MG tablet Take 1 tablet (20 mg total) by mouth daily. For depression 06/03/16   Armandina Stammer I, NP  hydrOXYzine (ATARAX/VISTARIL) 25 MG tablet Take 1 tablet (25 mg) four times daily as needed: For anxiety 06/02/16   Armandina Stammer I, NP  nicotine (NICODERM CQ - DOSED IN MG/24 HOURS) 21 mg/24hr patch Place 1 patch (21 mg total) onto the skin daily. For smoking cessation 06/03/16   Armandina Stammer I, NP  traZODone (DESYREL) 50 MG tablet Take 1 tablet (50 mg) at bedtime: For sleep 06/02/16   Sanjuana Kava, NP    Family History No family history on file.  Social History Social History   Tobacco Use  . Smoking status: Current Every Day Smoker    Packs/day: 0.50    Types: Cigarettes  . Smokeless tobacco: Never Used  Substance Use Topics  . Alcohol use:  Yes    Comment: socially   . Drug use: No     Allergies   Patient has no known allergies.   Review of Systems Review of Systems  Constitutional: Positive for chills. Negative for fever.  HENT: Positive for congestion and dental problem.   Respiratory: Positive for cough and chest tightness. Negative for shortness of breath.   Cardiovascular: Negative for chest pain and leg swelling.  Gastrointestinal: Negative for abdominal pain, nausea and vomiting.  All other systems reviewed and are negative.    Physical Exam Updated Vital Signs BP (!) 157/88 (BP  Location: Right Arm)   Pulse (!) 103   Temp 98.2 F (36.8 C) (Oral)   Resp 19   Wt 72.6 kg (160 lb)   SpO2 98%   BMI 22.32 kg/m   Physical Exam  Constitutional: He is oriented to person, place, and time.  Disheveled appearing, ruminating  HENT:  Head: Normocephalic and atraumatic.  Generally poor dentition, multiple dental caries, no palpable abscess, patient does have some blood oozing from the base of tooth #32, no trismus, no fullness noted under the tongue  Eyes: Pupils are equal, round, and reactive to light.  Cardiovascular: Normal rate, regular rhythm and normal heart sounds.  No murmur heard. Pulmonary/Chest: Effort normal and breath sounds normal. No respiratory distress. He has no wheezes.  Abdominal: Soft. There is no tenderness.  Musculoskeletal: He exhibits no edema.  Neurological: He is alert and oriented to person, place, and time.  Skin: Skin is warm and dry.  Psychiatric: He has a normal mood and affect.  Nursing note and vitals reviewed.    ED Treatments / Results  Labs (all labs ordered are listed, but only abnormal results are displayed) Labs Reviewed - No data to display  EKG None  Radiology No results found.  Procedures Procedures (including critical care time)  Medications Ordered in ED Medications - No data to display   Initial Impression / Assessment and Plan / ED Course  I have reviewed the triage vital signs and the nursing notes.  Pertinent labs & imaging results that were available during my care of the patient were reviewed by me and considered in my medical decision making (see chart for details).     Patient presents with multiple complaints.  He ruminates on history taking and I am unable to get him to focus on the symptoms that brought him in today.  Symptoms appear acute on chronic.  He is adamant that "I take care of his tooth.  There is no palpable abscess at this time.  He does have some bloody drainage from the base of tooth  #32.  He also has multiple dental caries notable on exam.  He reports that he has dental follow-up tomorrow.  Given that he reports being on multiple antibiotics over the last several months, would not know which antibiotic to treat with.  Would defer to dentist.  He reports close follow-up.  Regarding ongoing upper respiratory symptoms.  He is nontoxic-appearing and vital signs are largely reassuring.  98% on room air without any respiratory distress.  Breath sounds are clear.  My plan was to order EKG and screening x-ray to rule out pneumothorax or pneumonia.  Patient is requesting discharge paperwork.  Given that this is a chronic complaint, feel deferring this test is not unreasonable as he is low risk otherwise.  At discharge, patient is requesting pain medication.  I have encouraged ibuprofen or Tylenol for pain.  He was  given naproxen for pain.  Of note, I have reviewed the patient's chart.  He had a similar ED visit in May 2019 with multiple complaints including several complaints similar to tonight.  His work-up was reassuring at that time.  After history, exam, and medical workup I feel the patient has been appropriately medically screened and is safe for discharge home. Pertinent diagnoses were discussed with the patient. Patient was given return precautions.  Final Clinical Impressions(s) / ED Diagnoses   Final diagnoses:  Pain, dental  Anxiety    ED Discharge Orders    None       Berton Butrick, Mayer Masker, MD 10/11/17 0030    Shon Baton, MD 10/11/17 [phone]

## 2017-10-13 DIAGNOSIS — Z79899 Other long term (current) drug therapy: Secondary | ICD-10-CM | POA: Diagnosis not present

## 2017-11-01 DIAGNOSIS — Z0000 Encounter for general adult medical examination without abnormal findings: Secondary | ICD-10-CM | POA: Diagnosis not present

## 2017-11-01 DIAGNOSIS — G4700 Insomnia, unspecified: Secondary | ICD-10-CM | POA: Diagnosis not present

## 2017-12-05 ENCOUNTER — Ambulatory Visit (HOSPITAL_COMMUNITY): Payer: 59 | Admitting: Psychiatry

## 2017-12-06 DIAGNOSIS — R0602 Shortness of breath: Secondary | ICD-10-CM | POA: Diagnosis not present

## 2017-12-06 DIAGNOSIS — R5383 Other fatigue: Secondary | ICD-10-CM | POA: Diagnosis not present

## 2017-12-17 DIAGNOSIS — R69 Illness, unspecified: Secondary | ICD-10-CM | POA: Diagnosis not present

## 2018-02-04 ENCOUNTER — Other Ambulatory Visit: Payer: Self-pay

## 2018-02-04 ENCOUNTER — Emergency Department (HOSPITAL_COMMUNITY)
Admission: EM | Admit: 2018-02-04 | Discharge: 2018-02-05 | Disposition: A | Discharge status: Home / Self Care | Payer: 59 | Attending: Emergency Medicine | Admitting: Emergency Medicine

## 2018-02-04 ENCOUNTER — Encounter (HOSPITAL_COMMUNITY): Payer: Self-pay | Admitting: Emergency Medicine

## 2018-02-04 DIAGNOSIS — Z79899 Other long term (current) drug therapy: Secondary | ICD-10-CM | POA: Insufficient documentation

## 2018-02-04 DIAGNOSIS — F10920 Alcohol use, unspecified with intoxication, uncomplicated: Secondary | ICD-10-CM | POA: Diagnosis not present

## 2018-02-04 DIAGNOSIS — F17210 Nicotine dependence, cigarettes, uncomplicated: Secondary | ICD-10-CM | POA: Diagnosis not present

## 2018-02-04 DIAGNOSIS — R635 Abnormal weight gain: Secondary | ICD-10-CM | POA: Diagnosis not present

## 2018-02-04 DIAGNOSIS — K625 Hemorrhage of anus and rectum: Secondary | ICD-10-CM

## 2018-02-04 DIAGNOSIS — R42 Dizziness and giddiness: Secondary | ICD-10-CM | POA: Insufficient documentation

## 2018-02-04 DIAGNOSIS — R195 Other fecal abnormalities: Secondary | ICD-10-CM | POA: Diagnosis present

## 2018-02-04 NOTE — ED Triage Notes (Signed)
Pt C/O blood in stools. Pt states that this started around 1 year ago. Pt tell this nurse "I'm pretty damn drunk so my wife will have to tell you what is going on."

## 2018-02-04 NOTE — ED Provider Notes (Signed)
[facility] EMERGENCY DEPARTMENT Provider Note   CSN: [id number] Arrival date & time: 02/04/18  2311  Time seen 23:40 PM   History   Chief Complaint Chief Complaint  Patient presents with  . Blood In Stools    HPI Richard Larson is a 22 y.o. male.  HPI patient states he has been seeing blood in his stools for at least a year and a half.  He states the blood is mixed in the stools although sometimes he will have a normal bowel movement and then see some blood.  He denies being constipated.  However he states sometimes when he strains he sees blood but he also sees it when he does not strain.  He complains of a lot of right-sided abdominal pain but is not having it there now.  He states he is lightheaded and dizzy all the time for the past 6 months.  He states he has had a normal appetite and denies weight loss but states he is gained weight.  He states nothing is different today "I just want to find out what is wrong".  Family history is negative for GI problems such as inflammatory bowel disease.  PCP Benita Stabile, MD   Past Medical History:  Diagnosis Date  . Bipolar 1 disorder (HCC)   . Drug abuse (HCC)   . Suicide attempt [facility])     Patient Active Problem List   Diagnosis Date Noted  . Alcohol abuse with alcohol-induced mood disorder (HCC) 05/30/2016  . Acute encephalopathy 05/29/2016  . Acute respiratory failure (HCC) 05/29/2016  . Hypokalemia 05/29/2016  . Alcoholic intoxication with complication (HCC)   . Alcohol intoxication (HCC) 05/28/2016  . Bipolar 1 disorder (HCC) 05/28/2016  . Polysubstance abuse (HCC) 05/28/2016  . Suicidal behavior with attempted self-injury (HCC) 05/28/2016  . Involuntary commitment 05/28/2016  . Aspiration pneumonia (HCC) 05/28/2016    History reviewed. No pertinent surgical history.      Home Medications    Prior to Admission medications   Medication Sig Start Date End Date Taking? Authorizing Provider  ALPRAZolam [name]) 0.5  MG tablet Take 1 tablet (0.5 mg total) by mouth 3 (three) times daily as needed for anxiety. 09/03/17   Geoffery Lyons, MD  amoxicillin-clavulanate (AUGMENTIN) 875-125 MG tablet Take 1 tablet by mouth every 12 (twelve) hours. For infection (Sinusitis). 06/02/16   Armandina Stammer I, NP  benzonatate (TESSALON) 100 MG capsule Take 1 capsule (100 mg total) by mouth every 8 (eight) hours. 07/04/17   Couture, Cortni S, PA-C  escitalopram (LEXAPRO) 20 MG tablet Take 1 tablet (20 mg total) by mouth daily. For depression 06/03/16   Armandina Stammer I, NP  hydrocortisone (ANUSOL-HC) 25 MG suppository Place 1 suppository (25 mg total) rectally 2 (two) times daily. 02/05/18   Devoria Albe, MD  hydrOXYzine (ATARAX/VISTARIL) 25 MG tablet Take 1 tablet (25 mg) four times daily as needed: For anxiety 06/02/16   Armandina Stammer I, NP  nicotine (NICODERM CQ - DOSED IN MG/24 HOURS) 21 mg/24hr patch Place 1 patch (21 mg total) onto the skin daily. For smoking cessation 06/03/16   Armandina Stammer I, NP  traZODone (DESYREL) 50 MG tablet Take 1 tablet (50 mg) at bedtime: For sleep 06/02/16   Sanjuana Kava, NP    Family History No family history on file.  Social History Social History   Tobacco Use  . Smoking status: Current Every Day Smoker    Packs/day: 0.50    Types: Cigarettes  . Smokeless tobacco:  Never Used  Substance Use Topics  . Alcohol use: Yes    Comment: socially   . Drug use: No  unemployed Smokes 1/4 ppd Drinks alcohol 2 days ago, only a 12 pack if he drinks Here with spouse   Allergies   Patient has no known allergies.   Review of Systems Review of Systems  All other systems reviewed and are negative.    Physical Exam Updated Vital Signs BP 135/83 (BP Location: Right Arm)   Pulse 95   Temp 98 F (36.7 C) (Oral)   Resp 17   SpO2 99%   Vital signs normal   Orthostatic VS for the past 24 hrs:  BP- Lying Pulse- Lying BP- Sitting Pulse- Sitting BP- Standing at 0 minutes Pulse- Standing at 0 minutes    02/04/18 2355 126/90 84 100/90 95 (!) 113/100 100    Orthostatic vital signs show when he sits up his blood pressure does have a significant drop however when he stands up it is corrected.    Physical Exam  Constitutional: He is oriented to person, place, and time. He appears well-developed and well-nourished.  Non-toxic appearance. He does not appear ill. No distress.  Patient appears intoxicated  HENT:  Head: Normocephalic and atraumatic.  Right Ear: External ear normal.  Left Ear: External ear normal.  Nose: Nose normal. No mucosal edema or rhinorrhea.  Mouth/Throat: Oropharynx is clear and moist and mucous membranes are normal. No dental abscesses or uvula swelling.  Eyes: Pupils are equal, round, and reactive to light. Conjunctivae and EOM are normal.  Neck: Normal range of motion and full passive range of motion without pain. Neck supple.  Cardiovascular: Normal rate, regular rhythm and normal heart sounds. Exam reveals no gallop and no friction rub.  No murmur heard. Pulmonary/Chest: Effort normal and breath sounds normal. No respiratory distress. He has no wheezes. He has no rhonchi. He has no rales. He exhibits no tenderness and no crepitus.  Abdominal: Soft. Normal appearance and bowel sounds are normal. He exhibits no distension. There is tenderness. There is no rebound and no guarding.    Area where he states he gets pain is noted. Tonight he is having epigastric pain.   Musculoskeletal: Normal range of motion. He exhibits no edema or tenderness.  Moves all extremities well.   Neurological: He is alert and oriented to person, place, and time. He has normal strength. No cranial nerve deficit.  Skin: Skin is warm, dry and intact. No rash noted. No erythema. No pallor.  Psychiatric: He has a normal mood and affect. His speech is normal and behavior is normal. His mood appears not anxious.  Nursing note and vitals reviewed.    ED Treatments / Results  Labs (all labs  ordered are listed, but only abnormal results are displayed) Results for orders placed or performed during the hospital encounter of 02/04/18  Comprehensive metabolic panel  Result Value Ref Range   Sodium 143 135 - 145 mmol/L   Potassium 3.8 3.5 - 5.1 mmol/L   Chloride 104 98 - 111 mmol/L   CO2 31 22 - 32 mmol/L   Glucose, Bld 90 70 - 99 mg/dL   BUN 11 6 - 20 mg/dL   Creatinine, Ser 1.61 0.61 - 1.24 mg/dL   Calcium 9.4 8.9 - 09.6 mg/dL   Total Protein 8.8 (H) 6.5 - 8.1 g/dL   Albumin 5.3 (H) 3.5 - 5.0 g/dL   AST 28 15 - 41 U/L   ALT 37 0 -  44 U/L   Alkaline Phosphatase 68 38 - 126 U/L   Total Bilirubin 0.7 0.3 - 1.2 mg/dL   GFR calc non Af Amer >60 >60 mL/min   GFR calc Af Amer >60 >60 mL/min   Anion gap 8 5 - 15  CBC with Differential  Result Value Ref Range   WBC 6.5 4.0 - 10.5 K/uL   RBC 5.58 4.22 - 5.81 MIL/uL   Hemoglobin 17.3 (H) 13.0 - 17.0 g/dL   HCT 40.9 81.1 - 91.4 %   MCV 91.9 80.0 - 100.0 fL   MCH 31.0 26.0 - 34.0 pg   MCHC 33.7 30.0 - 36.0 g/dL   RDW 78.2 95.6 - 21.3 %   Platelets 339 150 - 400 K/uL   nRBC 0.0 0.0 - 0.2 %   Neutrophils Relative % 33 %   Neutro Abs 2.1 1.7 - 7.7 K/uL   Lymphocytes Relative 55 %   Lymphs Abs 3.6 0.7 - 4.0 K/uL   Monocytes Relative 6 %   Monocytes Absolute 0.4 0.1 - 1.0 K/uL   Eosinophils Relative 4 %   Eosinophils Absolute 0.3 0.0 - 0.5 K/uL   Basophils Relative 1 %   Basophils Absolute 0.1 0.0 - 0.1 K/uL   Immature Granulocytes 1 %   Abs Immature Granulocytes 0.05 0.00 - 0.07 K/uL  Ethanol  Result Value Ref Range   Alcohol, Ethyl (B) 367 (HH) <10 mg/dL  POC occult blood, ED RN will collect  Result Value Ref Range   Fecal Occult Bld NEGATIVE NEGATIVE    Laboratory interpretation all normal except alcohol intoxication, concentrated hemoglobin probably from dehydration     EKG None  Radiology No results found.  Procedures Procedures (including critical care time)  Medications Ordered in ED Medications - No  data to display   Initial Impression / Assessment and Plan / ED Course  I have reviewed the triage vital signs and the nursing notes.  Pertinent labs & imaging results that were available during my care of the patient were reviewed by me and considered in my medical decision making (see chart for details).    Laboratory testing was done to check for anemia, orthostatic vital signs were done to check for evidence of severe bleeding with decompensation.  Hemoccult was ordered.  Please note that patient arrived at the ED the same time I did in and I saw him driving to the ED.  He appears to be extremely intoxicated, alcohol level was done.  Recheck at 1:15 AM patient is sound asleep.  I talked to his wife and gave her the test results.  We discussed that he is stable from his rectal bleeding.  The most likely etiology is going to be possible internal hemorrhoids.  He was started on Anusol suppositories and he can follow-up with his primary care doctor for further evaluation.  She states she cannot drive so she is calling someone to pick them up from the ED.   Final Clinical Impressions(s) / ED Diagnoses   Final diagnoses:  Rectal bleeding  Alcoholic intoxication without complication [facility])    ED Discharge Orders         Ordered    hydrocortisone (ANUSOL-HC) 25 MG suppository  2 times daily     02/05/18 0119         Plan discharge  Devoria Albe, MD, Concha Pyo, MD 02/05/18 [phone]

## 2018-02-05 LAB — COMPREHENSIVE METABOLIC PANEL
ALT: 37 U/L (ref 0–44)
ANION GAP: 8 (ref 5–15)
AST: 28 U/L (ref 15–41)
Albumin: 5.3 g/dL — ABNORMAL HIGH (ref 3.5–5.0)
Alkaline Phosphatase: 68 U/L (ref 38–126)
BUN: 11 mg/dL (ref 6–20)
CHLORIDE: 104 mmol/L (ref 98–111)
CO2: 31 mmol/L (ref 22–32)
Calcium: 9.4 mg/dL (ref 8.9–10.3)
Creatinine, Ser: 0.99 mg/dL (ref 0.61–1.24)
GFR calc Af Amer: >60 mL/min (ref >60)
Glucose, Bld: 90 mg/dL (ref 70–99)
POTASSIUM: 3.8 mmol/L (ref 3.5–5.1)
Sodium: 143 mmol/L (ref 135–145)
Total Bilirubin: 0.7 mg/dL (ref 0.3–1.2)
Total Protein: 8.8 g/dL — ABNORMAL HIGH (ref 6.5–8.1)

## 2018-02-05 LAB — CBC WITH DIFFERENTIAL/PLATELET
ABS IMMATURE GRANULOCYTES: 0.05 10*3/uL (ref 0.00–0.07)
BASOS ABS: 0.1 10*3/uL (ref 0.0–0.1)
BASOS PCT: 1 %
Eosinophils Absolute: 0.3 10*3/uL (ref 0.0–0.5)
Eosinophils Relative: 4 %
HCT: 51.3 % (ref 39.0–52.0)
HEMOGLOBIN: 17.3 g/dL — AB (ref 13.0–17.0)
IMMATURE GRANULOCYTES: 1 %
LYMPHS PCT: 55 %
Lymphs Abs: 3.6 10*3/uL (ref 0.7–4.0)
MCH: 31 pg (ref 26.0–34.0)
MCHC: 33.7 g/dL (ref 30.0–36.0)
MCV: 91.9 fL (ref 80.0–100.0)
MONO ABS: 0.4 10*3/uL (ref 0.1–1.0)
Monocytes Relative: 6 %
NEUTROS ABS: 2.1 10*3/uL (ref 1.7–7.7)
NEUTROS PCT: 33 %
NRBC: 0 % (ref 0.0–0.2)
PLATELETS: 339 10*3/uL (ref 150–400)
RBC: 5.58 MIL/uL (ref 4.22–5.81)
RDW: 12.2 % (ref 11.5–15.5)
WBC: 6.5 10*3/uL (ref 4.0–10.5)

## 2018-02-05 LAB — ETHANOL: ALCOHOL ETHYL (B): 367 mg/dL — AB (ref <10)

## 2018-02-05 LAB — POC OCCULT BLOOD, ED: FECAL OCCULT BLD: NEGATIVE

## 2018-02-05 MED ORDER — HYDROCORTISONE ACETATE 25 MG RE SUPP: 25.0000 mg | Freq: Two times a day (BID) | RECTAL | 0 refills | Status: AC

## 2018-02-05 NOTE — ED Notes (Signed)
CRITICAL VALUE ALERT  Critical Value:  Ethanol 367  Date & Time Notied:  02/05/18 & 0118 hrs  Provider Notified: Dr. Lynelle Doctor  Orders Received/Actions taken: N/A

## 2018-02-05 NOTE — ED Notes (Signed)
Patient to receive ride home from girlfriend's father, who waiting in ED lobby.

## 2018-02-05 NOTE — Discharge Instructions (Addendum)
Use the suppositories in case you have inflamed internal hemorrhoids that is causing your rectal bleeding.  Your test tonight show that you are not anemic from the bleeding.  You are tolerating the bleeding well.  You can go to your primary care doctor, Dr. Margo Aye for further evaluation of your rectal bleeding.  Please do not drink so much.

## 2018-02-26 DIAGNOSIS — G4700 Insomnia, unspecified: Secondary | ICD-10-CM | POA: Diagnosis not present

## 2018-05-25 DIAGNOSIS — G4700 Insomnia, unspecified: Secondary | ICD-10-CM | POA: Diagnosis not present

## 2018-08-07 DIAGNOSIS — J309 Allergic rhinitis, unspecified: Secondary | ICD-10-CM | POA: Diagnosis not present

## 2018-08-07 DIAGNOSIS — R05 Cough: Secondary | ICD-10-CM | POA: Diagnosis not present

## 2018-08-07 DIAGNOSIS — R062 Wheezing: Secondary | ICD-10-CM | POA: Diagnosis not present

## 2018-08-30 DIAGNOSIS — M545 Low back pain: Secondary | ICD-10-CM | POA: Diagnosis not present

## 2018-09-10 DIAGNOSIS — M545 Low back pain: Secondary | ICD-10-CM | POA: Diagnosis not present

## 2018-09-10 DIAGNOSIS — M79651 Pain in right thigh: Secondary | ICD-10-CM | POA: Diagnosis not present

## 2018-09-10 DIAGNOSIS — G894 Chronic pain syndrome: Secondary | ICD-10-CM | POA: Diagnosis not present

## 2018-09-13 ENCOUNTER — Other Ambulatory Visit: Payer: Self-pay | Admitting: Pain Medicine

## 2018-09-13 DIAGNOSIS — M545 Low back pain, unspecified: Secondary | ICD-10-CM

## 2018-09-13 DIAGNOSIS — M79651 Pain in right thigh: Secondary | ICD-10-CM

## 2018-10-09 ENCOUNTER — Other Ambulatory Visit: Payer: Self-pay | Admitting: Pain Medicine

## 2018-10-11 ENCOUNTER — Other Ambulatory Visit: Payer: Self-pay

## 2018-10-11 ENCOUNTER — Ambulatory Visit
Admission: RE | Admit: 2018-10-11 | Discharge: 2018-10-11 | Disposition: A | Discharge status: Home / Self Care | Payer: 59 | Source: Ambulatory Visit | Attending: Pain Medicine | Admitting: Pain Medicine

## 2018-10-11 DIAGNOSIS — M79651 Pain in right thigh: Secondary | ICD-10-CM

## 2018-10-11 DIAGNOSIS — M545 Low back pain, unspecified: Secondary | ICD-10-CM

## 2018-11-24 DEATH — deceased

## 2019-06-26 IMAGING — MR MRI LUMBAR SPINE WITHOUT CONTRAST
4 of 5 series · 26 of 48 positions shown · non-contrast
Comparison: None available.

CLINICAL DATA: Initial evaluation for chronic low back pain with
radiation into the right lower extremity with associated numbness
and weakness.

EXAM:
MRI LUMBAR SPINE WITHOUT CONTRAST
TECHNIQUE: Multiplanar, multisequence MR imaging of the lumbar spine was
performed. No intravenous contrast was administered.

[Series 2: T2 · sagittal · 4.0mm · 1.09mm/px · 6 of 16 slices shown (1 of 2)]
[im 1/16]
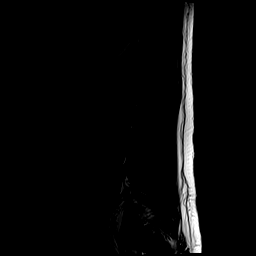
[im 4/16]
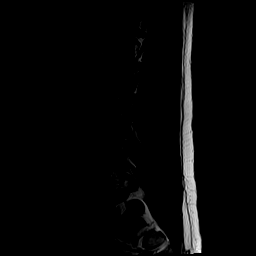
[im 7/16]
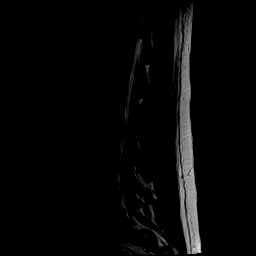
[im 10/16]
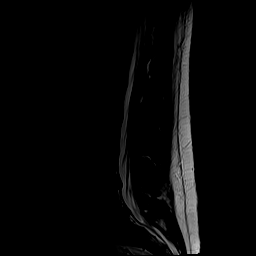
[im 13/16]
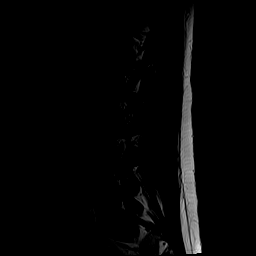
[im 16/16]
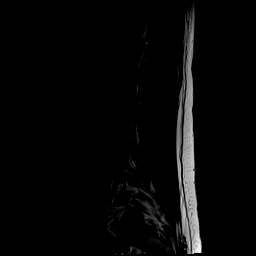

[Series 4: T1 · sagittal · 4.0mm · 1.09mm/px · 6 of 16 slices shown (1 of 2)]
[im 1/16]
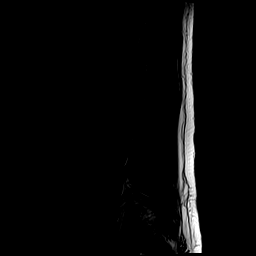
[im 4/16]
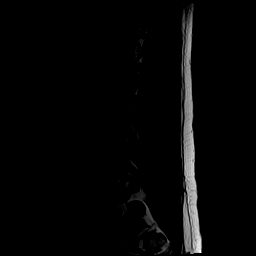
[im 7/16]
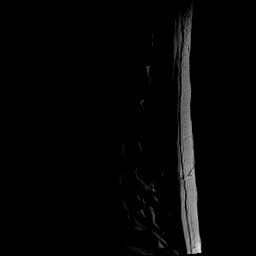
[im 10/16]
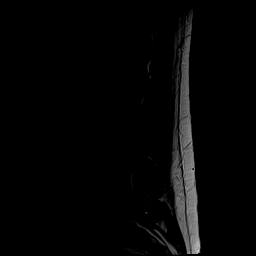
[im 13/16]
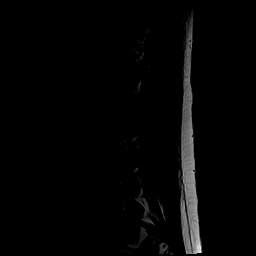
[im 16/16]
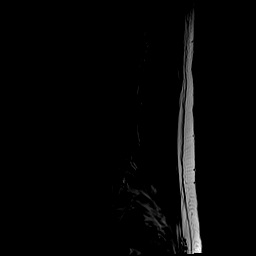

[Series 5: T2 · axial · 4.0mm · 0.39mm/px · z∈[-93,+143]mm · 9 of 43 slices shown (2 of 2)]
[im 1/43]
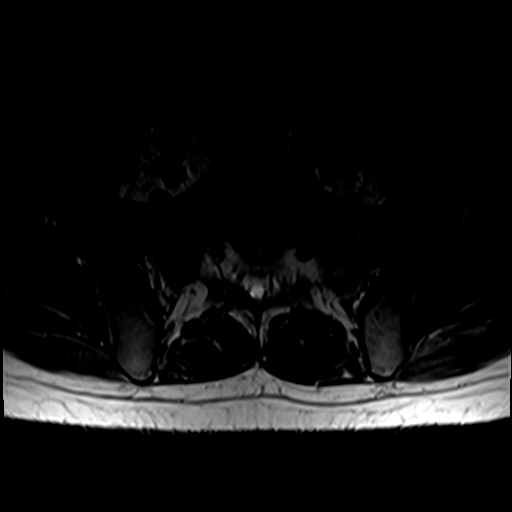
[im 7/43]
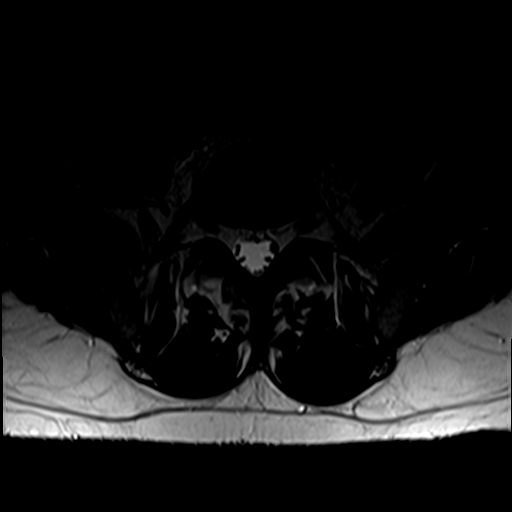
[im 13/43]
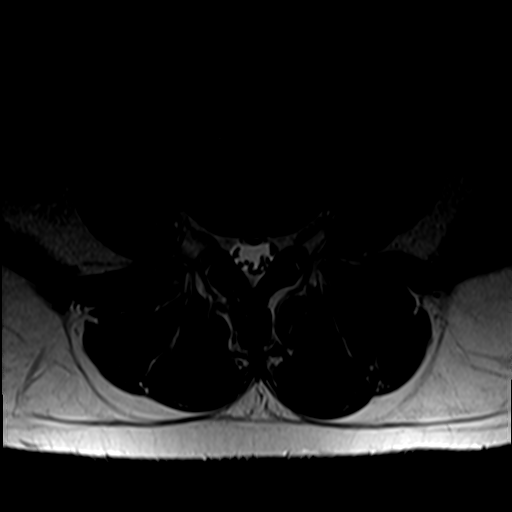
[im 19/43]
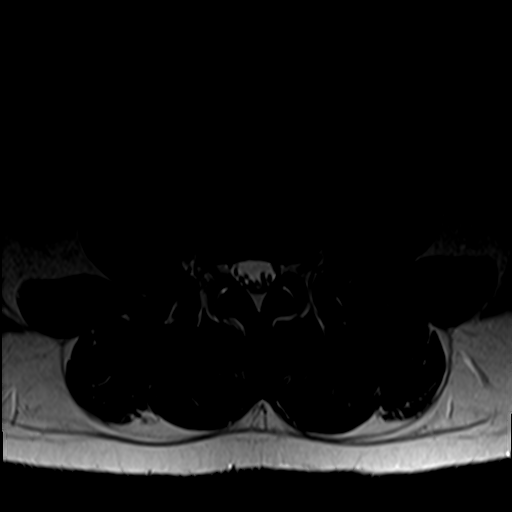
[im 22/43]
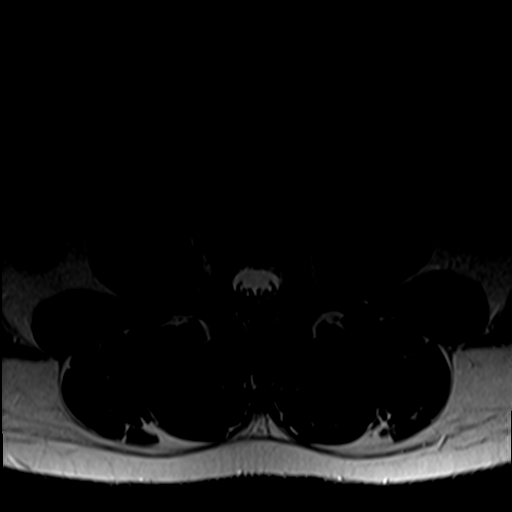
[im 25/43]
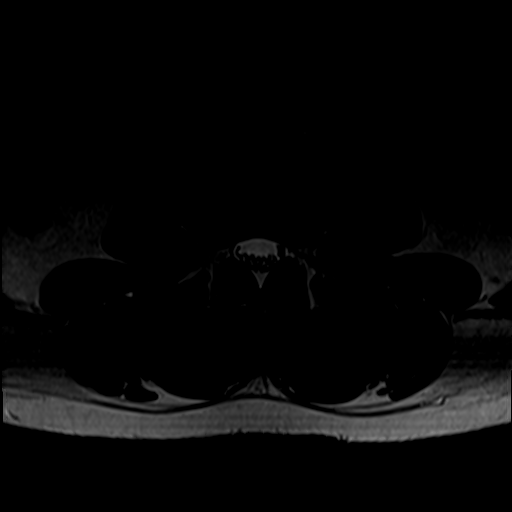
[im 31/43]
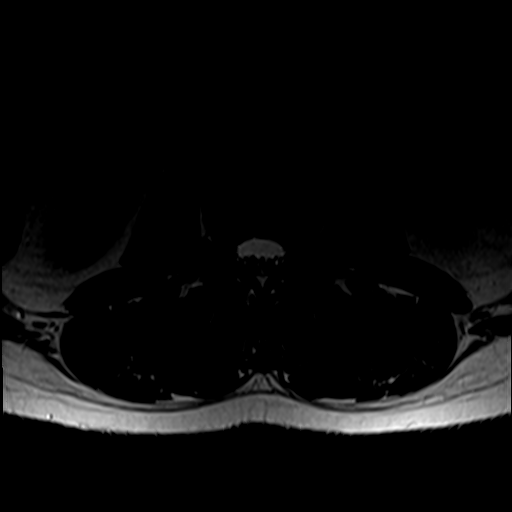
[im 37/43]
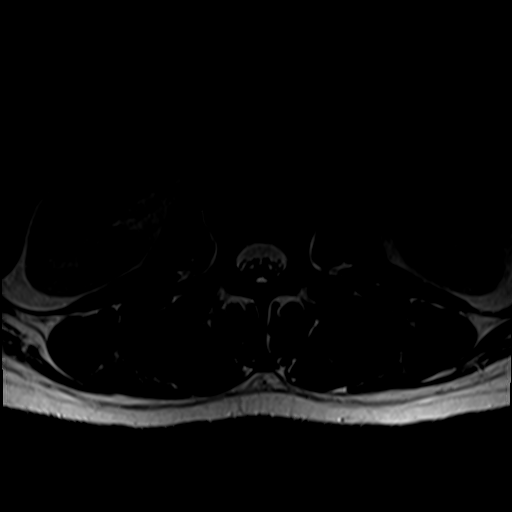
[im 43/43]
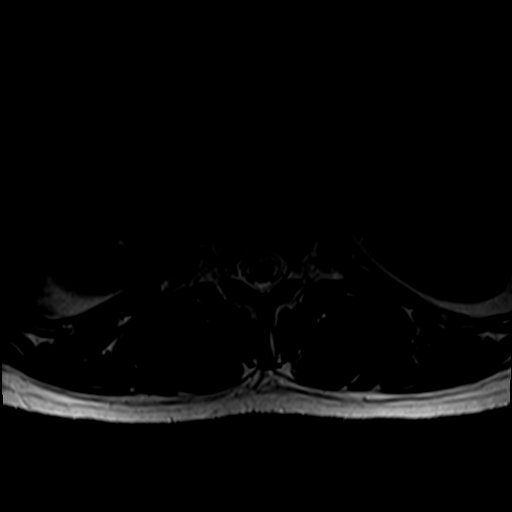

[Series 6: T1 · axial · 4.0mm · 0.39mm/px · z∈[-93,+115]mm · 5 of 43 slices shown (2 of 2)]
[im 1/43]
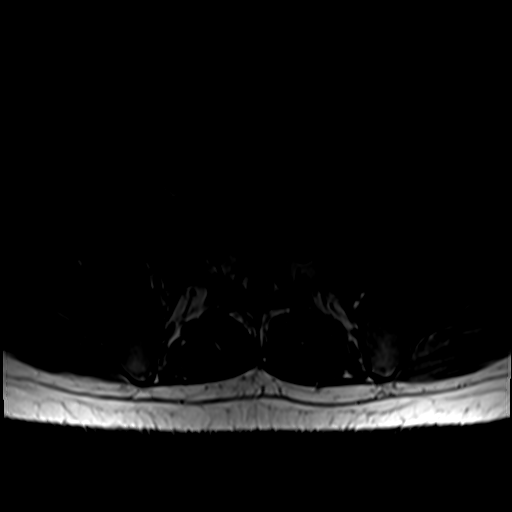
[im 7/43]
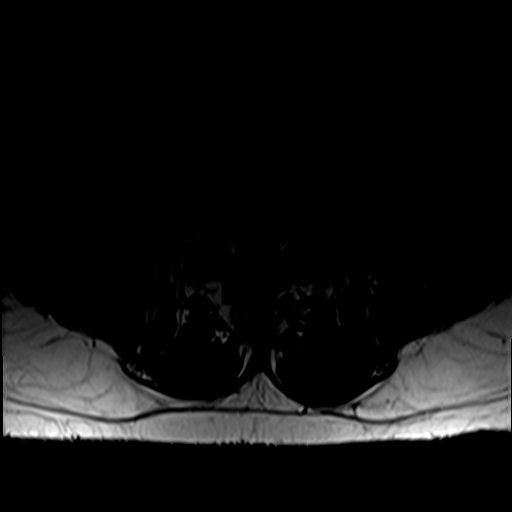
[im 13/43]
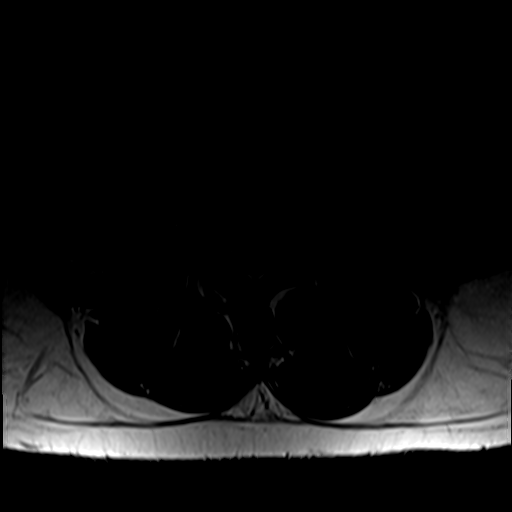
[im 22/43]
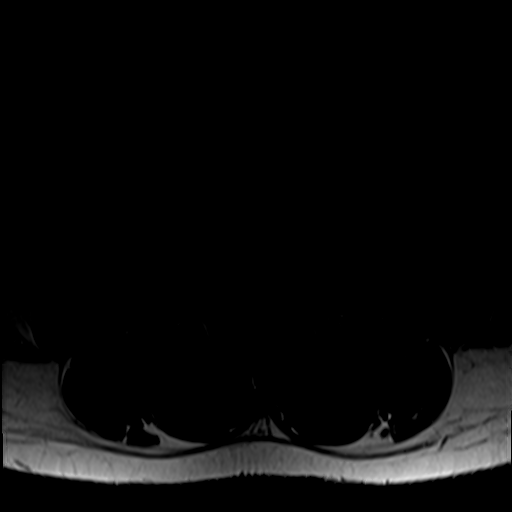
[im 37/43]
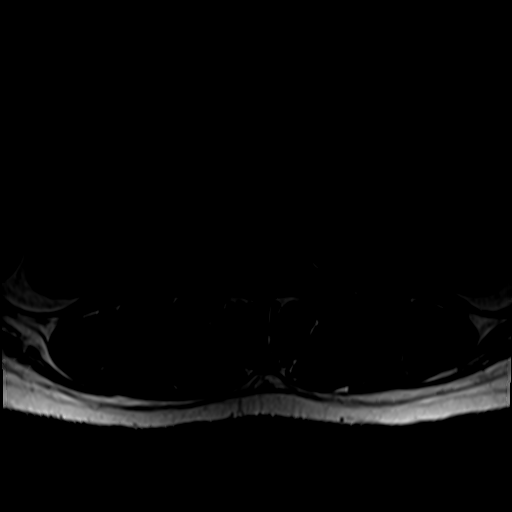

[26 of 48 positions shown; findings below may reference images not displayed]

FINDINGS: Segmentation: Standard. Lowest well-formed disc labeled the L5-S1
level.

Alignment: Vertebral bodies normally aligned with preservation of
the normal lumbar lordosis. No listhesis or subluxation.

Vertebrae: Vertebral body heights maintained without evidence for
acute or chronic fracture. Bone marrow signal intensity within
normal limits. No discrete or worrisome osseous lesions. Mild
reactive endplate changes present about the L4-5 and L5-S1
interspaces. No other abnormal marrow edema.

Conus medullaris and cauda equina: Conus extends to the L1 level.
Conus and cauda equina appear normal.

Paraspinal and other soft tissues: Paraspinous soft tissues within
normal limits. Visualized visceral structures are unremarkable.

Disc levels:

L1-2:  Unremarkable.

L2-3:  Unremarkable.

L3-4:  Unremarkable.

L4-5: Chronic intervertebral disc space narrowing with disc
desiccation and mild diffuse disc bulge. Superimposed broad-based
right subarticular disc protrusion extends into the right lateral
recess, contacting the descending right L5 nerve root (series 5,
image 32). Resultant mild right greater than left lateral recess
stenosis. Central canal remains patent. No significant foraminal
encroachment.

L5-S1: Chronic intervertebral disc space narrowing with disc
desiccation and mild reactive endplate changes. Broad base right
subarticular disc protrusion extends into the right lateral recess,
contacting and slightly displacing the descending right S1 nerve
root. Moderate right lateral recess stenosis. Central canal remains
patent. No significant foraminal stenosis.
IMPRESSION: 1. Broad-based right subarticular disc protrusion at L4-5,
contacting the descending right L5 nerve root in the right lateral
recess.
2. Broad right subarticular disc protrusion at L5-S1, contacting and
mildly displacing the descending right S1 nerve root as it courses
through the right lateral recess.
